# Patient Record
Sex: Female | Born: 1980 | Race: Black or African American | Hispanic: No | Marital: Single | Smoking: Former smoker
Health system: Southern US, Community
[De-identification: ages and names within clinical notes are randomized; demographics above are authoritative.]

## PROBLEM LIST (undated history)

## (undated) ENCOUNTER — Inpatient Hospital Stay (HOSPITAL_COMMUNITY): Payer: Self-pay

## (undated) DIAGNOSIS — I10 Essential (primary) hypertension: Secondary | ICD-10-CM

## (undated) DIAGNOSIS — Z5189 Encounter for other specified aftercare: Secondary | ICD-10-CM

## (undated) DIAGNOSIS — C801 Malignant (primary) neoplasm, unspecified: Secondary | ICD-10-CM

## (undated) DIAGNOSIS — F419 Anxiety disorder, unspecified: Secondary | ICD-10-CM

## (undated) HISTORY — PX: INDUCED ABORTION: SHX677

## (undated) HISTORY — PX: ECTOPIC PREGNANCY SURGERY: SHX613

---

## 2016-05-22 DIAGNOSIS — C9241 Acute promyelocytic leukemia, in remission: Secondary | ICD-10-CM | POA: Insufficient documentation

## 2016-05-22 DIAGNOSIS — I10 Essential (primary) hypertension: Secondary | ICD-10-CM | POA: Insufficient documentation

## 2016-05-22 DIAGNOSIS — D259 Leiomyoma of uterus, unspecified: Secondary | ICD-10-CM

## 2016-05-22 HISTORY — DX: Leiomyoma of uterus, unspecified: D25.9

## 2016-05-22 HISTORY — DX: Essential (primary) hypertension: I10

## 2016-09-01 ENCOUNTER — Ambulatory Visit (HOSPITAL_COMMUNITY)
Admission: EM | Admit: 2016-09-01 | Discharge: 2016-09-01 | Disposition: A | Discharge status: Home / Self Care | Payer: Medicaid Other | Attending: Internal Medicine | Admitting: Internal Medicine

## 2016-09-01 ENCOUNTER — Encounter (HOSPITAL_COMMUNITY): Payer: Self-pay | Admitting: Emergency Medicine

## 2016-09-01 DIAGNOSIS — S70361A Insect bite (nonvenomous), right thigh, initial encounter: Secondary | ICD-10-CM

## 2016-09-01 DIAGNOSIS — W57XXXA Bitten or stung by nonvenomous insect and other nonvenomous arthropods, initial encounter: Secondary | ICD-10-CM | POA: Diagnosis not present

## 2016-09-01 HISTORY — DX: Malignant (primary) neoplasm, unspecified: C80.1

## 2016-09-01 MED ORDER — TRIAMCINOLONE ACETONIDE 0.1 % EX CREA: 1.0000 "application " | TOPICAL_CREAM | Freq: Two times a day (BID) | CUTANEOUS | 0 refills | Status: DC

## 2016-09-01 NOTE — Discharge Instructions (Signed)
The lesions on the right thigh appear to be insect bites. They are mostly consistent with bedbugs bites but this cannot be proven.. They do not appear to be due to poisonous spider. As long as your not having any systemic symptoms such as fever, chills, feeling sick, nausea vomiting or other problems they should go away. Your receiving a prescription for triamcinolone cream to place on this area to help with pain, itching and discomfort. Use this medicine twice a day. He may also use Benadryl cream or gel 4 times a day on this area. The should heal up within a couple days or so.

## 2016-09-01 NOTE — ED Provider Notes (Signed)
CSN: [id number]     Arrival date & time 09/01/16  1620 History   First MD Initiated Contact with Patient 09/01/16 1741     Chief Complaint  Patient presents with  . Insect Bite   (Consider location/radiation/quality/duration/timing/severity/associated sxs/prior Treatment) 36 year old female sitting in a chair last night for a prolonged period of time. After getting up she was feeling some itching and stinging to the right posterior thigh. Upon examining this area  she discovered approximately 9 red itchy bumps appearing in a straight line. She states there was another employee that had been sitting in a chair a week earlier and received similar lesions on her body. The patient has a history of leukemia now in remission and she wanted to be checked to make sure that this would not be harmful to her.      Past Medical History:  Diagnosis Date  . Cancer ([place])    Leukemia APO   History reviewed. No pertinent surgical history. No family history on file. Social History  Substance Use Topics  . Smoking status: Never Smoker  . Smokeless tobacco: Never Used  . Alcohol use Yes     Comment: rarely- red wine   OB History    No data available     Review of Systems  Constitutional: Negative.   Respiratory: Negative.   Gastrointestinal: Negative.   Skin:       As per history of present illness  Neurological: Negative.   All other systems reviewed and are negative.   Allergies  Patient has no known allergies.  Home Medications   Prior to Admission medications   Medication Sig Start Date End Date Taking? Authorizing Provider  triamcinolone cream (KENALOG) 0.1 % Apply 1 application topically 2 (two) times daily. 09/01/16   Janne Napoleon, NP   Meds Ordered and Administered this Visit  Medications - No data to display  BP 135/77 (BP Location: Right Arm)   Pulse 85   Temp 98.6 F (37 C) (Oral)   Resp 16   Ht 5\' 2"  (1.575 m)   Wt 211 lb (95.7 kg)   LMP 08/11/2016   SpO2 98%    BMI 38.59 kg/m  No data found.   Physical Exam  Constitutional: She is oriented to person, place, and time. She appears well-developed and well-nourished.  Eyes: EOM are normal.  Neck: Neck supple.  Cardiovascular: Normal rate.   Pulmonary/Chest: Effort normal. No respiratory distress.  Neurological: She is alert and oriented to person, place, and time.  Skin: Skin is warm and dry. Capillary refill takes less than 2 seconds.  7-9 raised red papules or "bumps" arranged in a straight line to the right upper posterior thigh running vertically. The length is approximately 8 inches. No drainage. No bleeding. No signs of infection. No cellulitis.  Psychiatric: She has a normal mood and affect.  Nursing note and vitals reviewed.   Urgent Care Course     Procedures (including critical care time)  Labs Review Labs Reviewed - No data to display  Imaging Review No results found.   Visual Acuity Review  Right Eye Distance:   Left Eye Distance:   Bilateral Distance:    Right Eye Near:   Left Eye Near:    Bilateral Near:         MDM   1. Insect bite, initial encounter    The lesions on the right thigh appear to be insect bites. They are mostly consistent with bedbugs bites but this cannot be  proven.. They do not appear to be due to poisonous spider. As long as your not having any systemic symptoms such as fever, chills, feeling sick, nausea vomiting or other problems they should go away. Your receiving a prescription for triamcinolone cream to place on this area to help with pain, itching and discomfort. Use this medicine twice a day. He may also use Benadryl cream or gel 4 times a day on this area. The should heal up within a couple days or so. Meds ordered this encounter  Medications  . triamcinolone cream (KENALOG) 0.1 %    Sig: Apply 1 application topically 2 (two) times daily.    Dispense:  30 g    Refill:  0    Order Specific Question:   Supervising Provider    Answer:    Sherlene Shams [[id number]]   Post note: After the patient left andLivia, NA was cleaning the room she saw small bugs on the chair that the patient was sitting. She wiped across the chair and noticed that the small bugs were crushed and there was blood on the chair. The room was then cleaned his best as possible and then closed until housekeeping can complete the cleaning process. The patient was called and notified of this finding.    Janne Napoleon, NP 09/01/16 1801    Janne Napoleon, NP 09/01/16 2032

## 2016-09-01 NOTE — ED Triage Notes (Signed)
PT in remission of Leukemia APO. PT sat on a leather chair at work yesterday. PT reports she quickly noticed 9 bites up her right leg. PT reports bites have gotten larger and her leg is painful.

## 2017-01-06 ENCOUNTER — Encounter (HOSPITAL_COMMUNITY): Payer: Self-pay

## 2017-01-06 ENCOUNTER — Emergency Department (HOSPITAL_COMMUNITY)
Admission: EM | Admit: 2017-01-06 | Discharge: 2017-01-06 | Disposition: A | Discharge status: Home / Self Care | Payer: Medicaid Other | Attending: Emergency Medicine | Admitting: Emergency Medicine

## 2017-01-06 DIAGNOSIS — K649 Unspecified hemorrhoids: Secondary | ICD-10-CM | POA: Insufficient documentation

## 2017-01-06 DIAGNOSIS — M7918 Myalgia, other site: Secondary | ICD-10-CM | POA: Diagnosis present

## 2017-01-06 DIAGNOSIS — R52 Pain, unspecified: Secondary | ICD-10-CM

## 2017-01-06 MED ORDER — HYDROCORTISONE 1 % EX CREA: TOPICAL_CREAM | CUTANEOUS | 0 refills | Status: DC

## 2017-01-06 MED ORDER — PRAMOXINE HCL 1 % RE FOAM: 1.0000 "application " | Freq: Three times a day (TID) | RECTAL | 0 refills | Status: DC | PRN

## 2017-01-06 NOTE — ED Provider Notes (Signed)
[facility] EMERGENCY DEPART[facility] Provider Note   CSN: [id number] Arrival date & time: 01/06/17  1956     History   Chief Complaint Chief Complaint  Patient presents with  . Generalized Body Aches    HPI Alyssa Short is a 36 y.o. female.  HPI Alyssa Short is a 36 y.o. female presents to emergency department complaining of body aches and a hemorrhoid.  Patient states that she has felt achy all day today.  She states that she thinks is because she worked out yesterday.  She states she worked out strenuously after not working out for a while.  She denies any swelling in extremities.  She denies any fever or chills.  She denies any upper respiratory symptoms.  She has not taken any medications for this, she states "I do not take medicines."  She also states she has a small hemorrhoid that is uncomfortable and painful.  She has not tried any medications on it.  She states this started a few days ago.  She states she has had a once in the past and has used rectal cream that helped.  States having normal stools.  No blood per rectum.  No other complaints.  Past Medical History:  Diagnosis Date  . Cancer ([place])    Leukemia APO    There are no active problems to display for this patient.   History reviewed. No pertinent surgical history.  OB History    No data available       Home Medications    Prior to Admission medications   Medication Sig Start Date End Date Taking? Authorizing Provider  triamcinolone cream (KENALOG) 0.1 % Apply 1 application topically 2 (two) times daily. 09/01/16   Janne Napoleon, NP    Family History History reviewed. No pertinent family history.  Social History Social History  Substance Use Topics  . Smoking status: Never Smoker  . Smokeless tobacco: Never Used  . Alcohol use Yes     Comment: rarely- red wine     Allergies   Patient has no known allergies.   Review of Systems Review of Systems  Constitutional: Negative for  chills and fever.  Respiratory: Negative for cough, chest tightness and shortness of breath.   Cardiovascular: Negative for chest pain, palpitations and leg swelling.  Gastrointestinal: Positive for rectal pain. Negative for abdominal pain, blood in stool, diarrhea, nausea and vomiting.  Genitourinary: Negative for dysuria, flank pain, pelvic pain, vaginal bleeding, vaginal discharge and vaginal pain.  Musculoskeletal: Positive for arthralgias and myalgias. Negative for neck pain and neck stiffness.  Skin: Negative for rash.  Neurological: Negative for dizziness, weakness and headaches.  All other systems reviewed and are negative.    Physical Exam Updated Vital Signs BP (!) 142/94   Pulse 94   Temp 98.3 F (36.8 C) (Oral)   Resp 16   LMP 12/21/2016   SpO2 99%   Physical Exam  Constitutional: She appears well-developed and well-nourished. No distress.  HENT:  Head: Normocephalic.  Right Ear: External ear normal.  Left Ear: External ear normal.  Nose: Nose normal.  Mouth/Throat: Oropharynx is clear and moist.  Eyes: Conjunctivae are normal.  Neck: Neck supple.  Cardiovascular: Normal rate, regular rhythm and normal heart sounds.   Pulmonary/Chest: Effort normal and breath sounds normal. No respiratory distress. She has no wheezes. She has no rales.  Abdominal: Soft. Bowel sounds are normal. She exhibits no distension. There is no tenderness. There is no rebound.  Genitourinary:  Genitourinary Comments: Tiny, non-thrombosed hemorrhoid to the rectum.  Musculoskeletal: She exhibits no edema.  No obvious upper or lower extremity swelling.  Diffuse tenderness to palpation over the muscles.  Peripheral pulses intact, specifically distal radial and dorsalis pedis pulses  Neurological: She is alert.  Skin: Skin is warm and dry.  Psychiatric: She has a normal mood and affect. Her behavior is normal.  Nursing note and vitals reviewed.    ED Treatments / Results  Labs (all labs  ordered are listed, but only abnormal results are displayed) Labs Reviewed - No data to display  EKG  EKG Interpretation None       Radiology No results found.  Procedures Procedures (including critical care time)  Medications Ordered in ED Medications - No data to display   Initial Impression / Assessment and Plan / ED Course  I have reviewed the triage vital signs and the nursing notes.  Pertinent labs & imaging results that were available during my care of the patient were reviewed by me and considered in my medical decision making (see chart for details).     Patient with body aches, presumably from exercising yesterday.  Although considered rhabdomyolysis, doubt, patient states she has clear urine and no swelling in extremities.  Patient is moving without much difficulty during exam.  She is afebrile.  She is nontoxic appearing.  She has no upper respiratory symptoms.  Advised to take Tylenol Motrin and watch for color of the urine or fever.  Hemorrhoid will treat with hydrocortisone cream Proctofoam, sitz baths.  Follow-up as needed.  Vitals:   01/06/17 2003  BP: (!) 142/94  Pulse: 94  Resp: 16  Temp: 98.3 F (36.8 C)  TempSrc: Oral  SpO2: 99%     Final Clinical Impressions(s) / ED Diagnoses   Final diagnoses:  Body aches  Hemorrhoids, unspecified hemorrhoid type    New Prescriptions New Prescriptions   HYDROCORTISONE CREAM 1 %    Apply to affected area 2 times daily   PRAMOXINE (PROCTOFOAM) 1 % FOAM    Place 1 application rectally 3 (three) times daily as needed for anal itching.     Jeannett Senior, PA-C 01/06/17 2126    Duffy Bruce, MD 01/07/17 [phone]

## 2017-01-06 NOTE — Discharge Instructions (Signed)
Warm baths for hemorrhoids several times a day.  Apply hydrocortisone cream and Proctofoam topically for symptom relief.  Take Tylenol or Motrin for any muscular pain.  Watch for darkening of the urine or swelling to extremities.  If that occurs, follow-up with your doctor or return to emergency department. Increase fluid intake

## 2017-01-06 NOTE — ED Triage Notes (Signed)
Onset today generalized body aches.  Daughter has had cold/cough symptoms x 3 days.  Pt also exercised yesterday so she thinks that might be cause of body aches.  NO other s/s noted.

## 2017-01-31 ENCOUNTER — Emergency Department (HOSPITAL_COMMUNITY)
Admission: EM | Admit: 2017-01-31 | Discharge: 2017-02-01 | Discharge status: Against Medical Advice | Payer: Medicaid Other

## 2017-01-31 NOTE — ED Triage Notes (Signed)
Patient was called for triage with no answer.

## 2017-02-01 ENCOUNTER — Other Ambulatory Visit: Payer: Self-pay

## 2017-02-01 ENCOUNTER — Emergency Department (HOSPITAL_BASED_OUTPATIENT_CLINIC_OR_DEPARTMENT_OTHER)
Admission: EM | Admit: 2017-02-01 | Discharge: 2017-02-01 | Disposition: A | Discharge status: Home / Self Care | Payer: Medicaid Other | Attending: Emergency Medicine | Admitting: Emergency Medicine

## 2017-02-01 ENCOUNTER — Encounter (HOSPITAL_BASED_OUTPATIENT_CLINIC_OR_DEPARTMENT_OTHER): Payer: Self-pay

## 2017-02-01 DIAGNOSIS — Z856 Personal history of leukemia: Secondary | ICD-10-CM | POA: Diagnosis not present

## 2017-02-01 DIAGNOSIS — R05 Cough: Secondary | ICD-10-CM | POA: Diagnosis present

## 2017-02-01 DIAGNOSIS — J069 Acute upper respiratory infection, unspecified: Secondary | ICD-10-CM | POA: Insufficient documentation

## 2017-02-01 DIAGNOSIS — B9789 Other viral agents as the cause of diseases classified elsewhere: Secondary | ICD-10-CM | POA: Diagnosis not present

## 2017-02-01 HISTORY — DX: Encounter for other specified aftercare: Z51.89

## 2017-02-01 NOTE — ED Notes (Signed)
Pt verbalizes understanding of d/c instructions and denies any further needs at this time. 

## 2017-02-01 NOTE — Discharge Instructions (Signed)
You may alternate Tylenol 1000 mg every 6 hours as needed for fever and pain and ibuprofen 800 mg every 8 hours as needed for fever and pain. Please rest and drink plenty of fluids. This is a viral illness causing your symptoms. You do not need antibiotics for a virus. You may use over-the-counter nasal saline spray and Afrin nasal saline spray as needed for nasal congestion. Please do not use Afrin for more than 3 days in a row. You may use Mucinex as needed for cough.  This may take 7-14 days to run its course.

## 2017-02-01 NOTE — ED Triage Notes (Signed)
Pt c/o cold symptoms for one day, states she has to be checked out due to hx of cancer, no fevers, no wheezing, pt is speaking in complete sentences and not having difficulty.  Pt has tried theraflu twice

## 2017-02-01 NOTE — ED Provider Notes (Signed)
TIME SEEN: 12:50 AM  CHIEF COMPLAINT: "I have a cold"  HPI: Patient is a 36 year old female with previous history of leukemia in remission for 2 years who presents to the emergency department with a "cold".  States she has had a dry cough, nasal congestion, body aches and fatigue for the past 2 days.  No fevers.  No shortness of breath.  States she thinks she has been wheezing.  No history of asthma.  No vomiting or diarrhea.  No sore throat.  Has not had an influenza vaccination.  Significant other recently had similar symptom on their own after several days.  He did not require antibiotics.  ROS: See HPI Constitutional: no fever  Eyes: no drainage  ENT: no runny nose   Cardiovascular:  no chest pain  Resp: no SOB  GI: no vomiting GU: no dysuria Integumentary: no rash  Allergy: no hives  Musculoskeletal: no leg swelling  Neurological: no slurred speech ROS otherwise negative  PAST MEDICAL HISTORY/PAST SURGICAL HISTORY:  Past Medical History:  Diagnosis Date  . Blood transfusion without reported diagnosis   . Cancer ([place])    Leukemia APO    MEDICATIONS:  Prior to Admission medications   Medication Sig Start Date End Date Taking? Authorizing Provider  hydrocortisone cream 1 % Apply to affected area 2 times daily 01/06/17   Kirichenko, Tatyana, PA-C  pramoxine (PROCTOFOAM) 1 % foam Place 1 application rectally 3 (three) times daily as needed for anal itching. 01/06/17   Kirichenko, Lahoma Rocker, PA-C  triamcinolone cream (KENALOG) 0.1 % Apply 1 application topically 2 (two) times daily. 09/01/16   Janne Napoleon, NP    ALLERGIES:  No Known Allergies  SOCIAL HISTORY:  Social History   Tobacco Use  . Smoking status: Never Smoker  . Smokeless tobacco: Never Used  Substance Use Topics  . Alcohol use: Yes    Comment: rarely- red wine    FAMILY HISTORY: No family history on file.  EXAM: BP 134/88 (BP Location: Right Arm)   Pulse 90   Temp 98.2 F (36.8 C) (Oral)   Resp 18   Ht  5\' 2"  (1.575 m)   Wt 96.2 kg (212 lb)   LMP 01/17/2017   SpO2 100%   BMI 38.78 kg/m  CONSTITUTIONAL: Alert and oriented and responds appropriately to questions. Well-appearing; well-nourished, afebrile and nontoxic HEAD: Normocephalic EYES: Conjunctivae clear, pupils appear equal, EOMI ENT: normal nose; moist mucous membranes; No pharyngeal erythema or petechiae, no tonsillar hypertrophy or exudate, no uvular deviation, no unilateral swelling, no trismus or drooling, no muffled voice, normal phonation, no stridor, no dental caries present, no drainable dental abscess noted, no Ludwig's angina, tongue sits flat in the bottom of the mouth, no angioedema, no facial erythema or warmth, no facial swelling; no pain with movement of the neck. NECK: Supple, no meningismus, no nuchal rigidity, no LAD  CARD: RRR; S1 and S2 appreciated; no murmurs, no clicks, no rubs, no gallops RESP: Normal chest excursion without splinting or tachypnea; breath sounds clear and equal bilaterally; no wheezes, no rhonchi, no rales, no hypoxia or respiratory distress, speaking full sentences ABD/GI: Normal bowel sounds; non-distended; soft, non-tender, no rebound, no guarding, no peritoneal signs, no hepatosplenomegaly BACK:  The back appears normal and is non-tender to palpation, there is no CVA tenderness EXT: Normal ROM in all joints; non-tender to palpation; no edema; normal capillary refill; no cyanosis, no calf tenderness or swelling    SKIN: Normal color for age and race; warm; no rash  NEURO: Moves all extremities equally PSYCH: The patient's mood and manner are appropriate. Grooming and personal hygiene are appropriate.  MEDICAL DECISION MAKING: Patient here with likely viral URI.  Her lungs are clear to auscultation without wheezing.  Afebrile.  I do not feel she has pneumonia.  Do not feel she needs a chest x-ray and she is comfortable with this.  Discussed with her that this is likely viral illness and could even  be influenza but less likely given no fever.  Recommended over-the-counter supportive care.  She is comfortable with this plan.  States the major reason she is here is because she needs a note for work.  She is asking to be out for the next couple of days which I do not feel is unreasonable.  I have even offered her a prescription for cough medication but she declines this.  He states she feels she is doing well with TheraFlu at home.  We discussed return precautions.  Patient comfortable with this plan.  I do not feel she needs antibiotics.  No signs of sepsis, meningitis, peritonsillar abscess, pharyngitis, deep space neck infection, pneumonia on exam.  At this time, I do not feel there is any life-threatening condition present. I have reviewed and discussed all results (EKG, imaging, lab, urine as appropriate) and exam findings with patient/family. I have reviewed nursing notes and appropriate previous records.  I feel the patient is safe to be discharged home without further emergent workup and can continue workup as an outpatient as needed. Discussed usual and customary return precautions. Patient/family verbalize understanding and are comfortable with this plan.  Outpatient follow-up has been provided if needed. All questions have been answered.      Ronna Herskowitz, Delice Bison, DO 02/01/17 [phone]

## 2017-05-16 ENCOUNTER — Emergency Department (HOSPITAL_BASED_OUTPATIENT_CLINIC_OR_DEPARTMENT_OTHER)
Admission: EM | Admit: 2017-05-16 | Discharge: 2017-05-16 | Disposition: A | Discharge status: Home / Self Care | Payer: Medicare Other | Attending: Emergency Medicine | Admitting: Emergency Medicine

## 2017-05-16 ENCOUNTER — Encounter (HOSPITAL_BASED_OUTPATIENT_CLINIC_OR_DEPARTMENT_OTHER): Payer: Self-pay | Admitting: Emergency Medicine

## 2017-05-16 ENCOUNTER — Other Ambulatory Visit: Payer: Self-pay

## 2017-05-16 ENCOUNTER — Emergency Department (HOSPITAL_BASED_OUTPATIENT_CLINIC_OR_DEPARTMENT_OTHER): Payer: Medicare Other

## 2017-05-16 DIAGNOSIS — R0602 Shortness of breath: Secondary | ICD-10-CM | POA: Diagnosis not present

## 2017-05-16 DIAGNOSIS — E876 Hypokalemia: Secondary | ICD-10-CM | POA: Diagnosis not present

## 2017-05-16 DIAGNOSIS — R002 Palpitations: Secondary | ICD-10-CM | POA: Diagnosis not present

## 2017-05-16 DIAGNOSIS — F419 Anxiety disorder, unspecified: Secondary | ICD-10-CM | POA: Insufficient documentation

## 2017-05-16 DIAGNOSIS — I10 Essential (primary) hypertension: Secondary | ICD-10-CM | POA: Insufficient documentation

## 2017-05-16 DIAGNOSIS — Z856 Personal history of leukemia: Secondary | ICD-10-CM | POA: Insufficient documentation

## 2017-05-16 HISTORY — DX: Essential (primary) hypertension: I10

## 2017-05-16 LAB — PREGNANCY, URINE: Preg Test, Ur: NEGATIVE

## 2017-05-16 LAB — BASIC METABOLIC PANEL
Anion gap: 10 (ref 5–15)
BUN: 11 mg/dL (ref 6–20)
CHLORIDE: 103 mmol/L (ref 101–111)
CO2: 22 mmol/L (ref 22–32)
CREATININE: 0.72 mg/dL (ref 0.44–1.00)
Calcium: 9.5 mg/dL (ref 8.9–10.3)
GFR calc Af Amer: >60 mL/min (ref >60)
GFR calc non Af Amer: >60 mL/min (ref >60)
Glucose, Bld: 119 mg/dL — ABNORMAL HIGH (ref 65–99)
Potassium: 3.1 mmol/L — ABNORMAL LOW (ref 3.5–5.1)
Sodium: 135 mmol/L (ref 135–145)

## 2017-05-16 LAB — CBC
HCT: 36.5 % (ref 36.0–46.0)
Hemoglobin: 12.3 g/dL (ref 12.0–15.0)
MCH: 31.6 pg (ref 26.0–34.0)
MCHC: 33.7 g/dL (ref 30.0–36.0)
MCV: 93.8 fL (ref 78.0–100.0)
PLATELETS: 351 10*3/uL (ref 150–400)
RBC: 3.89 MIL/uL (ref 3.87–5.11)
RDW: 12.3 % (ref 11.5–15.5)
WBC: 9.6 10*3/uL (ref 4.0–10.5)

## 2017-05-16 LAB — MAGNESIUM: MAGNESIUM: 1.9 mg/dL (ref 1.7–2.4)

## 2017-05-16 LAB — TROPONIN I: Troponin I: <0.03 ng/mL (ref <0.03)

## 2017-05-16 LAB — D-DIMER, QUANTITATIVE: D-Dimer, Quant: 0.29 ug/mL-FEU (ref 0.00–0.50)

## 2017-05-16 MED ORDER — ALPRAZOLAM 0.25 MG PO TABS: 0.2500 mg | ORAL_TABLET | Freq: Three times a day (TID) | ORAL | 0 refills | Status: DC | PRN

## 2017-05-16 MED ORDER — POTASSIUM CHLORIDE CRYS ER 20 MEQ PO TBCR
40.0000 meq | EXTENDED_RELEASE_TABLET | Freq: Once | ORAL | Status: AC
Start: 2017-05-16 — End: 2017-05-16
  Administered 2017-05-16: 40 meq via ORAL
  Filled 2017-05-16: qty 2

## 2017-05-16 MED ORDER — POTASSIUM CHLORIDE CRYS ER 20 MEQ PO TBCR: 20.0000 meq | EXTENDED_RELEASE_TABLET | Freq: Every day | ORAL | 0 refills | Status: DC

## 2017-05-16 MED ORDER — ALPRAZOLAM 0.5 MG PO TABS
0.2500 mg | ORAL_TABLET | Freq: Once | ORAL | Status: AC
  Administered 2017-05-16: 0.25 mg via ORAL
  Filled 2017-05-16: qty 1

## 2017-05-16 NOTE — ED Triage Notes (Signed)
Palpitations intermittently over the past few days. Denies chest pain.

## 2017-05-16 NOTE — ED Notes (Signed)
Pt verbalizes understanding of d/c instructions and denies any further needs at this time. 

## 2017-05-16 NOTE — ED Provider Notes (Signed)
[facility] EMERGENCY DEPARTMENT Provider Note   CSN: [id number] Arrival date & time: 05/16/17  2032     History   Chief Complaint Chief Complaint  Patient presents with  . Palpitations    HPI Alyssa Short is a 37 y.o. female.  HPI Patient presents with worsening palpitations of the last week.  States she has some mild associated shortness of breath.  Symptoms are exacerbated or being in the hospital around doctors.  Patient states he does drink large chai tea daily.  Denies any other stimulant use.  Denies any chest pain.  Denies recent weight loss.  No fever or chills.   Past Medical History:  Diagnosis Date  . Blood transfusion without reported diagnosis   . Cancer ([place])    Leukemia APO  . Hypertension     There are no active problems to display for this patient.   Past Surgical History:  Procedure Laterality Date  . ECTOPIC PREGNANCY SURGERY      OB History    No data available       Home Medications    Prior to Admission medications   Medication Sig Start Date End Date Taking? Authorizing Provider  ALPRAZolam (XANAX) 0.25 MG tablet Take 1 tablet (0.25 mg total) by mouth 3 (three) times daily as needed for anxiety. 05/16/17   Julianne Rice, MD  hydrocortisone cream 1 % Apply to affected area 2 times daily 01/06/17   Kirichenko, Lahoma Rocker, PA-C  potassium chloride SA (K-DUR,KLOR-CON) 20 MEQ tablet Take 1 tablet (20 mEq total) by mouth daily. 05/16/17   Julianne Rice, MD  pramoxine (PROCTOFOAM) 1 % foam Place 1 application rectally 3 (three) times daily as needed for anal itching. 01/06/17   Kirichenko, Lahoma Rocker, PA-C  triamcinolone cream (KENALOG) 0.1 % Apply 1 application topically 2 (two) times daily. 09/01/16   Janne Napoleon, NP    Family History No family history on file.  Social History Social History   Tobacco Use  . Smoking status: Never Smoker  . Smokeless tobacco: Never Used  Substance Use Topics  . Alcohol use: Yes    Comment: rarely-  red wine  . Drug use: Yes    Types: Marijuana     Allergies   Patient has no known allergies.   Review of Systems Review of Systems  Constitutional: Negative for chills, fatigue and fever.  HENT: Negative for congestion, facial swelling, sore throat and trouble swallowing.   Eyes: Negative for visual disturbance.  Respiratory: Positive for shortness of breath. Negative for cough and wheezing.   Cardiovascular: Positive for palpitations. Negative for chest pain and leg swelling.  Gastrointestinal: Negative for abdominal pain, constipation, diarrhea, nausea and vomiting.  Genitourinary: Negative for dysuria, flank pain and frequency.  Musculoskeletal: Negative for back pain, myalgias, neck pain and neck stiffness.  Skin: Negative for rash and wound.  Neurological: Negative for dizziness, tremors, weakness, light-headedness, numbness and headaches.  Psychiatric/Behavioral: The patient is nervous/anxious.   All other systems reviewed and are negative.    Physical Exam Updated Vital Signs BP 131/88   Pulse 90   Temp 98.8 F (37.1 C) (Oral)   Resp 17   Ht 5\' 2"  (1.575 m)   Wt 96.2 kg (212 lb)   LMP 04/21/2017   SpO2 100%   BMI 38.78 kg/m   Physical Exam  Constitutional: She is oriented to person, place, and time. She appears well-developed and well-nourished. No distress.  HENT:  Head: Normocephalic and atraumatic.  Mouth/Throat: Oropharynx is clear  and moist. No oropharyngeal exudate.  Eyes: EOM are normal. Pupils are equal, round, and reactive to light.  Neck: Normal range of motion. Neck supple. No JVD present.  Cardiovascular: Regular rhythm.  Tachycardia  Pulmonary/Chest: Effort normal and breath sounds normal. No stridor. No respiratory distress. She has no wheezes. She has no rales. She exhibits no tenderness.  Abdominal: Soft. Bowel sounds are normal. There is no tenderness. There is no rebound and no guarding.  Musculoskeletal: Normal range of motion. She  exhibits no edema or tenderness.  Mild right calf swelling compared to left.  No tenderness.  Distal pulses are 2+.  Lymphadenopathy:    She has no cervical adenopathy.  Neurological: She is alert and oriented to person, place, and time.  Patient is alert and oriented x3 with clear, goal oriented speech. Patient has 5/5 motor in all extremities. Sensation is intact to light touch.  Mild tremor noted in bilateral upper extremities.  Skin: Skin is warm and dry. Capillary refill takes less than 2 seconds. No rash noted. She is not diaphoretic. No erythema.  Psychiatric: She has a normal mood and affect. Her behavior is normal.  Nursing note and vitals reviewed.    ED Treatments / Results  Labs (all labs ordered are listed, but only abnormal results are displayed) Labs Reviewed  BASIC METABOLIC PANEL - Abnormal; Notable for the following components:      Result Value   Potassium 3.1 (*)    Glucose, Bld 119 (*)    All other components within normal limits  CBC  TROPONIN I  PREGNANCY, URINE  D-DIMER, QUANTITATIVE (NOT AT [facility])  MAGNESIUM  TSH    EKG  EKG Interpretation  Date/Time:  Wednesday May 16 2017 20:40:02 EDT Ventricular Rate:  137 PR Interval:  136 QRS Duration: 84 QT Interval:  280 QTC Calculation: 422 R Axis:   34 Text Interpretation:  Sinus tachycardia Otherwise normal ECG Confirmed by Julianne Rice [phone]) on 05/16/2017 10:24:03 PM       Radiology Dg Chest 2 View  Result Date: 05/16/2017 CLINICAL DATA:  Palpitations for a few days. EXAM: CHEST - 2 VIEW COMPARISON:  None. FINDINGS: The heart size and mediastinal contours are within normal limits. Both lungs are clear. The visualized skeletal structures are unremarkable. IMPRESSION: Normal chest. Electronically Signed   By: Ulyses Jarred M.D.   On: 05/16/2017 23:07    Procedures Procedures (including critical care time)  Medications Ordered in ED Medications  ALPRAZolam Duanne Moron) tablet 0.25 mg (0.25 mg  Oral Given 05/16/17 2222)  potassium chloride SA (K-DUR,KLOR-CON) CR tablet 40 mEq (40 mEq Oral Given 05/16/17 2225)     Initial Impression / Assessment and Plan / ED Course  I have reviewed the triage vital signs and the nursing notes.  Pertinent labs & imaging results that were available during my care of the patient were reviewed by me and considered in my medical decision making (see chart for details).    Patient states she is feeling much better after Xanax.  Heart rate is not rising over 100.  D-dimer is normal.  Chest x-ray is normal.  Patient does have hypokalemia.  Given oral replacement in the emergency department.  Suspect symptoms likely related to anxiety.  However, will give follow-up with cardiology and advised follow-up with her primary physician.  Return precautions have been given.   Final Clinical Impressions(s) / ED Diagnoses   Final diagnoses:  Palpitations  Anxiety  Hypokalemia    ED Discharge Orders  Ordered    ALPRAZolam (XANAX) 0.25 MG tablet  3 times daily PRN     05/16/17 2333    potassium chloride SA (K-DUR,KLOR-CON) 20 MEQ tablet  Daily     05/16/17 2333       Julianne Rice, MD 05/16/17 2342

## 2017-05-17 LAB — TSH: TSH: 3.178 u[IU]/mL (ref 0.350–4.500)

## 2017-12-23 ENCOUNTER — Other Ambulatory Visit: Payer: Self-pay

## 2017-12-23 ENCOUNTER — Emergency Department (HOSPITAL_BASED_OUTPATIENT_CLINIC_OR_DEPARTMENT_OTHER)
Admission: EM | Admit: 2017-12-23 | Discharge: 2017-12-23 | Disposition: A | Discharge status: Home / Self Care | Payer: Medicare Other | Attending: Emergency Medicine | Admitting: Emergency Medicine

## 2017-12-23 ENCOUNTER — Encounter (HOSPITAL_BASED_OUTPATIENT_CLINIC_OR_DEPARTMENT_OTHER): Payer: Self-pay | Admitting: *Deleted

## 2017-12-23 DIAGNOSIS — F1210 Cannabis abuse, uncomplicated: Secondary | ICD-10-CM | POA: Insufficient documentation

## 2017-12-23 DIAGNOSIS — I10 Essential (primary) hypertension: Secondary | ICD-10-CM | POA: Diagnosis not present

## 2017-12-23 DIAGNOSIS — R110 Nausea: Secondary | ICD-10-CM | POA: Diagnosis not present

## 2017-12-23 DIAGNOSIS — Z856 Personal history of leukemia: Secondary | ICD-10-CM | POA: Insufficient documentation

## 2017-12-23 DIAGNOSIS — R002 Palpitations: Secondary | ICD-10-CM | POA: Insufficient documentation

## 2017-12-23 DIAGNOSIS — E876 Hypokalemia: Secondary | ICD-10-CM

## 2017-12-23 DIAGNOSIS — F419 Anxiety disorder, unspecified: Secondary | ICD-10-CM | POA: Diagnosis not present

## 2017-12-23 LAB — COMPREHENSIVE METABOLIC PANEL
ALT: 21 U/L (ref 0–44)
AST: 22 U/L (ref 15–41)
Albumin: 4.5 g/dL (ref 3.5–5.0)
Alkaline Phosphatase: 72 U/L (ref 38–126)
Anion gap: 10 (ref 5–15)
BUN: 7 mg/dL (ref 6–20)
CO2: 25 mmol/L (ref 22–32)
CREATININE: 0.62 mg/dL (ref 0.44–1.00)
Calcium: 9.8 mg/dL (ref 8.9–10.3)
Chloride: 103 mmol/L (ref 98–111)
Glucose, Bld: 113 mg/dL — ABNORMAL HIGH (ref 70–99)
Potassium: 3 mmol/L — ABNORMAL LOW (ref 3.5–5.1)
Sodium: 138 mmol/L (ref 135–145)
Total Bilirubin: 0.3 mg/dL (ref 0.3–1.2)
Total Protein: 7.9 g/dL (ref 6.5–8.1)

## 2017-12-23 LAB — CBC
HCT: 38 % (ref 36.0–46.0)
Hemoglobin: 12.3 g/dL (ref 12.0–15.0)
MCH: 30.3 pg (ref 26.0–34.0)
MCHC: 32.4 g/dL (ref 30.0–36.0)
MCV: 93.6 fL (ref 80.0–100.0)
NRBC: 0 % (ref 0.0–0.2)
PLATELETS: 397 10*3/uL (ref 150–400)
RBC: 4.06 MIL/uL (ref 3.87–5.11)
RDW: 13 % (ref 11.5–15.5)
WBC: 7.9 10*3/uL (ref 4.0–10.5)

## 2017-12-23 LAB — URINALYSIS, ROUTINE W REFLEX MICROSCOPIC
BILIRUBIN URINE: NEGATIVE
GLUCOSE, UA: NEGATIVE mg/dL
HGB URINE DIPSTICK: NEGATIVE
KETONES UR: NEGATIVE mg/dL
LEUKOCYTES UA: NEGATIVE
Nitrite: NEGATIVE
PROTEIN: NEGATIVE mg/dL
Specific Gravity, Urine: <1.005 — ABNORMAL LOW (ref 1.005–1.030)
pH: 5.5 (ref 5.0–8.0)

## 2017-12-23 LAB — PREGNANCY, URINE: Preg Test, Ur: NEGATIVE

## 2017-12-23 LAB — LIPASE, BLOOD: Lipase: 20 U/L (ref 11–51)

## 2017-12-23 MED ORDER — ONDANSETRON HCL 4 MG/2ML IJ SOLN
4.0000 mg | Freq: Once | INTRAMUSCULAR | Status: AC
  Administered 2017-12-23: 4 mg via INTRAVENOUS
  Filled 2017-12-23: qty 2

## 2017-12-23 MED ORDER — HYDROXYZINE HCL 25 MG PO TABS: 25.0000 mg | ORAL_TABLET | Freq: Every evening | ORAL | 0 refills | Status: DC | PRN

## 2017-12-23 MED ORDER — POTASSIUM CHLORIDE CRYS ER 20 MEQ PO TBCR
40.0000 meq | EXTENDED_RELEASE_TABLET | Freq: Once | ORAL | Status: AC
  Administered 2017-12-23: 40 meq via ORAL
  Filled 2017-12-23: qty 2

## 2017-12-23 MED ORDER — LORAZEPAM 2 MG/ML IJ SOLN
1.0000 mg | Freq: Once | INTRAMUSCULAR | Status: AC
  Administered 2017-12-23: 1 mg via INTRAVENOUS
  Filled 2017-12-23: qty 1

## 2017-12-23 NOTE — ED Notes (Signed)
Pt active in lobby, eating and drinking without problem

## 2017-12-23 NOTE — ED Notes (Signed)
Pt tolerating POs at this time

## 2017-12-23 NOTE — ED Triage Notes (Addendum)
Pt reports that she thinks she is having panic attacks.  She is in remission from CA. States that she has been nauseated after eating x 3 days. Reports posterior neck pain after going to the gym. Pt ambulatory. No acute distress noted.  Pt does report using Marijuana today.

## 2017-12-23 NOTE — Discharge Instructions (Signed)
Please follow up with counselor or therapist. Eat potassium rich foods - a list has been included Take Hydroxyzine as needed for sleep/anxiety Please return if you are worsening

## 2017-12-23 NOTE — ED Provider Notes (Signed)
[facility] EMERGENCY DEPARTMENT Provider Note   CSN: [id number] Arrival date & time: 12/23/17  1547     History   Chief Complaint Chief Complaint  Patient presents with  . Nausea    HPI Alyssa Short is a 37 y.o. female who presents with multiple complaints. PMH significant for leukemia in remission, anxiety/PTSD. She was seen in the ED in March 2019 for palpitations and testing came back normal and she was told she has anxiety. She was given Xanax and on review of EMR this seemed to help although now the patient states that it doesn't really help. She is not receiving any treatment for anxiety at this time. For the past three days she has had a feeling of nausea. She never gets to the point where she needs to vomit. She denies any pain associated with this. She states that she is under increased stress because any time she has to talk about cancer it makes her very anxious and have palpitations. Her friend recently called her and was asking questions about cancer because her white count was elevated and this triggered her. She denies fever, chest pain, SOB, abdominal pain, change in bowels, dysuria. No recent surgery/immobilization, hx of cancer, leg swelling, hemoptysis, prior DVT/PE, or hormone use. She did recently go to [facility] by train.  Additionally she has been having some left sided neck and shoulder pain. She has been working out recently and thinks it might be from that. It hurts when she moves a certain way. It is not constant or severe.  HPI  Past Medical History:  Diagnosis Date  . Blood transfusion without reported diagnosis   . Cancer ([place])    Leukemia APO  . Hypertension     There are no active problems to display for this patient.   Past Surgical History:  Procedure Laterality Date  . ECTOPIC PREGNANCY SURGERY       OB History   None      Home Medications    Prior to Admission medications   Medication Sig Start Date End Date Taking? Authorizing  Provider  ALPRAZolam (XANAX) 0.25 MG tablet Take 1 tablet (0.25 mg total) by mouth 3 (three) times daily as needed for anxiety. 05/16/17   Julianne Rice, MD  hydrocortisone cream 1 % Apply to affected area 2 times daily 01/06/17   Kirichenko, Lahoma Rocker, PA-C  potassium chloride SA (K-DUR,KLOR-CON) 20 MEQ tablet Take 1 tablet (20 mEq total) by mouth daily. 05/16/17   Julianne Rice, MD  pramoxine (PROCTOFOAM) 1 % foam Place 1 application rectally 3 (three) times daily as needed for anal itching. 01/06/17   Kirichenko, Lahoma Rocker, PA-C  triamcinolone cream (KENALOG) 0.1 % Apply 1 application topically 2 (two) times daily. 09/01/16   Janne Napoleon, NP    Family History History reviewed. No pertinent family history.  Social History Social History   Tobacco Use  . Smoking status: Never Smoker  . Smokeless tobacco: Never Used  Substance Use Topics  . Alcohol use: Yes    Comment: rarely- red wine  . Drug use: Yes    Types: Marijuana     Allergies   Patient has no known allergies.   Review of Systems Review of Systems  Constitutional: Positive for appetite change. Negative for chills and fever.  Respiratory: Negative for shortness of breath.   Cardiovascular: Positive for palpitations. Negative for chest pain and leg swelling.  Gastrointestinal: Positive for nausea. Negative for abdominal pain, constipation, diarrhea and vomiting.  Genitourinary: Negative for  dysuria.  Musculoskeletal: Positive for myalgias and neck pain.  Neurological: Negative for headaches.  Psychiatric/Behavioral: The patient is nervous/anxious.   All other systems reviewed and are negative.    Physical Exam Updated Vital Signs BP (!) 187/95 (BP Location: Left Arm)   Pulse (!) 132   Temp 98.1 F (36.7 C) (Oral)   Resp 20   Ht 5\' 2"  (1.575 m)   Wt 96.2 kg   LMP 12/20/2017   SpO2 100%   BMI 38.78 kg/m   Physical Exam  Constitutional: She is oriented to person, place, and time. She appears well-developed  and well-nourished. No distress.  Cooperative. Mildly anxious. Pleasant  HENT:  Head: Normocephalic and atraumatic.  Eyes: Pupils are equal, round, and reactive to light. Conjunctivae are normal. Right eye exhibits no discharge. Left eye exhibits no discharge. No scleral icterus.  Neck: Normal range of motion.  Mild tenderness over left cervical paraspinal muscles and left trapezius  Cardiovascular: Regular rhythm. Tachycardia present. Exam reveals no gallop and no friction rub.  No murmur heard. Pulmonary/Chest: Effort normal and breath sounds normal. No respiratory distress.  Abdominal: Soft. Bowel sounds are normal. She exhibits no distension. There is no tenderness.  Musculoskeletal:  No lower leg edema  Neurological: She is alert and oriented to person, place, and time.  Skin: Skin is warm and dry.  Psychiatric: Her behavior is normal. Her mood appears anxious.  Nursing note and vitals reviewed.    ED Treatments / Results  Labs (all labs ordered are listed, but only abnormal results are displayed) Labs Reviewed  COMPREHENSIVE METABOLIC PANEL - Abnormal; Notable for the following components:      Result Value   Potassium 3.0 (*)    Glucose, Bld 113 (*)    All other components within normal limits  URINALYSIS, ROUTINE W REFLEX MICROSCOPIC - Abnormal; Notable for the following components:   Specific Gravity, Urine <1.005 (*)    All other components within normal limits  LIPASE, BLOOD  CBC  PREGNANCY, URINE    EKG EKG Interpretation  Date/Time:  Sunday December 23 2017 16:00:28 EDT Ventricular Rate:  134 PR Interval:  138 QRS Duration: 90 QT Interval:  292 QTC Calculation: 436 R Axis:   41 Text Interpretation:  Sinus tachycardia Otherwise normal ECG similar to prior 3/19 Confirmed by Aletta Edouard [phone]) on 12/23/2017 4:02:56 PM Also confirmed by Aletta Edouard [phone]), editor Philomena Doheny [phone])  on 12/23/2017 4:04:40 PM   Radiology No results  found.  Procedures Procedures (including critical care time)  Medications Ordered in ED Medications  LORazepam (ATIVAN) injection 1 mg (1 mg Intravenous Given 12/23/17 1736)  ondansetron (ZOFRAN) injection 4 mg (4 mg Intravenous Given 12/23/17 1737)  potassium chloride SA (K-DUR,KLOR-CON) CR tablet 40 mEq (40 mEq Oral Given 12/23/17 1944)     Initial Impression / Assessment and Plan / ED Course  I have reviewed the triage vital signs and the nursing notes.  Pertinent labs & imaging results that were available during my care of the patient were reviewed by me and considered in my medical decision making (see chart for details).  37 year old female presents with multiple symptoms. She is tachycardic in to 130s and hypertensive. She openly admits to her anxiety and stress. On exam she has mild, reproducible neck and trapezius tenderness, clear lungs, soft, non-tender abdomen. Will obtain labs, pregnancy test. She is technically not PERC negative because of her HR. Her Wells' score puts her in the low risk group. With  no chest pain, SOB, hypoxia, or clinical signs of DVT will forego d-dimer at this time.  Labs are overall normal other than hypokalemia. Discussed results with patient. Her HR and BP have improved after Ativan and Zofran. She was encouraged to follow-up with a counselor or therapist to address her uncontrolled anxiety.  She is given prescription for hydroxyzine for anxiety and sleep.  Final Clinical Impressions(s) / ED Diagnoses   Final diagnoses:  Nausea  Anxiety  Hypokalemia    ED Discharge Orders    None       Recardo Evangelist, PA-C 12/23/17 2200    Hayden Rasmussen, MD 12/23/17 2320

## 2018-01-25 ENCOUNTER — Emergency Department (HOSPITAL_BASED_OUTPATIENT_CLINIC_OR_DEPARTMENT_OTHER)
Admission: EM | Admit: 2018-01-25 | Discharge: 2018-01-25 | Disposition: A | Discharge status: Home / Self Care | Payer: Medicare Other | Attending: Emergency Medicine | Admitting: Emergency Medicine

## 2018-01-25 ENCOUNTER — Encounter (HOSPITAL_BASED_OUTPATIENT_CLINIC_OR_DEPARTMENT_OTHER): Payer: Self-pay | Admitting: *Deleted

## 2018-01-25 ENCOUNTER — Inpatient Hospital Stay (EMERGENCY_DEPARTMENT_HOSPITAL)
Admission: AD | Admit: 2018-01-25 | Discharge: 2018-01-26 | Disposition: A | Discharge status: Home / Self Care | Payer: Medicare Other | Source: Ambulatory Visit | Attending: Obstetrics & Gynecology | Admitting: Obstetrics & Gynecology

## 2018-01-25 ENCOUNTER — Other Ambulatory Visit: Payer: Self-pay

## 2018-01-25 DIAGNOSIS — D259 Leiomyoma of uterus, unspecified: Secondary | ICD-10-CM

## 2018-01-25 DIAGNOSIS — Z8759 Personal history of other complications of pregnancy, childbirth and the puerperium: Secondary | ICD-10-CM

## 2018-01-25 DIAGNOSIS — Z9079 Acquired absence of other genital organ(s): Secondary | ICD-10-CM

## 2018-01-25 DIAGNOSIS — R109 Unspecified abdominal pain: Secondary | ICD-10-CM | POA: Insufficient documentation

## 2018-01-25 DIAGNOSIS — Z3491 Encounter for supervision of normal pregnancy, unspecified, first trimester: Secondary | ICD-10-CM

## 2018-01-25 DIAGNOSIS — O26891 Other specified pregnancy related conditions, first trimester: Secondary | ICD-10-CM

## 2018-01-25 DIAGNOSIS — Z5321 Procedure and treatment not carried out due to patient leaving prior to being seen by health care provider: Secondary | ICD-10-CM | POA: Insufficient documentation

## 2018-01-25 DIAGNOSIS — O3411 Maternal care for benign tumor of corpus uteri, first trimester: Secondary | ICD-10-CM | POA: Insufficient documentation

## 2018-01-25 DIAGNOSIS — Z87891 Personal history of nicotine dependence: Secondary | ICD-10-CM | POA: Insufficient documentation

## 2018-01-25 DIAGNOSIS — Z3A01 Less than 8 weeks gestation of pregnancy: Secondary | ICD-10-CM | POA: Insufficient documentation

## 2018-01-25 HISTORY — DX: Anxiety disorder, unspecified: F41.9

## 2018-01-25 NOTE — ED Notes (Signed)
During triage pt inquiring about plan of care. I spoke with dr. Florina Ou and order rec'd for hcg quant level. Discussed with pt we would take her to treatment room,  draw labs, and she would likely need transfer to [facility] for ultrasound. Pt said she "called ahead" and was told that we did have ultrasound here, I discussed with her that we do not have it 24 hours. Pt [facility] that [facility] Advised her that we are happy to take care of her here, pt decided to leave. Pt ambulatory with sig other out triage doors.

## 2018-01-25 NOTE — ED Triage Notes (Signed)
Pt reports lower abdominal pain that started today. Pt denies vaginal bleeding or vaginal d/c. LMP 12/20/2017, pt last saw OBGYN on Thursday, she says they called her today and told her to have her levels redrawn on Tuesday. Pt reports prev ectopic pregnancy.

## 2018-01-25 NOTE — ED Notes (Signed)
Pt never in room 9 or seen by this RN

## 2018-01-26 ENCOUNTER — Encounter (HOSPITAL_COMMUNITY): Payer: Self-pay | Admitting: *Deleted

## 2018-01-26 ENCOUNTER — Inpatient Hospital Stay (HOSPITAL_COMMUNITY): Payer: Medicare Other

## 2018-01-26 DIAGNOSIS — Z3A01 Less than 8 weeks gestation of pregnancy: Secondary | ICD-10-CM

## 2018-01-26 DIAGNOSIS — R109 Unspecified abdominal pain: Secondary | ICD-10-CM

## 2018-01-26 DIAGNOSIS — Z8759 Personal history of other complications of pregnancy, childbirth and the puerperium: Secondary | ICD-10-CM

## 2018-01-26 DIAGNOSIS — O26891 Other specified pregnancy related conditions, first trimester: Secondary | ICD-10-CM

## 2018-01-26 LAB — URINALYSIS, ROUTINE W REFLEX MICROSCOPIC
BILIRUBIN URINE: NEGATIVE
GLUCOSE, UA: NEGATIVE mg/dL
HGB URINE DIPSTICK: NEGATIVE
Ketones, ur: 20 mg/dL — AB
Leukocytes, UA: NEGATIVE
Nitrite: NEGATIVE
Protein, ur: NEGATIVE mg/dL
SPECIFIC GRAVITY, URINE: 1.009 (ref 1.005–1.030)
pH: 6 (ref 5.0–8.0)

## 2018-01-26 LAB — CBC
HEMATOCRIT: 35.7 % — AB (ref 36.0–46.0)
Hemoglobin: 11.9 g/dL — ABNORMAL LOW (ref 12.0–15.0)
MCH: 31 pg (ref 26.0–34.0)
MCHC: 33.3 g/dL (ref 30.0–36.0)
MCV: 93 fL (ref 80.0–100.0)
Platelets: 371 10*3/uL (ref 150–400)
RBC: 3.84 MIL/uL — ABNORMAL LOW (ref 3.87–5.11)
RDW: 13.8 % (ref 11.5–15.5)
WBC: 10 10*3/uL (ref 4.0–10.5)
nRBC: 0 % (ref 0.0–0.2)

## 2018-01-26 LAB — WET PREP, GENITAL
SPERM: NONE SEEN
Trich, Wet Prep: NONE SEEN
Yeast Wet Prep HPF POC: NONE SEEN

## 2018-01-26 LAB — HCG, QUANTITATIVE, PREGNANCY: hCG, Beta Chain, Quant, S: 7147 m[IU]/mL — ABNORMAL HIGH (ref <5)

## 2018-01-26 MED ORDER — HYDROXYZINE HCL 25 MG PO TABS: 25.0000 mg | ORAL_TABLET | Freq: Three times a day (TID) | ORAL | 2 refills | Status: DC | PRN

## 2018-01-26 NOTE — Progress Notes (Signed)
Lisa Leftwich-Kirby CNM in earlier to discuss test results and d/c plan. Written and verbal d/c instructions given and understanding voiced. 

## 2018-01-26 NOTE — MAU Note (Signed)
Pt has hx ectopic with R tube removed. Had positive upt 01/22/18. Had quant done [facility] couple days ago. Had some L shoulder pain today and googled it and saw could be ectopic. No vag bleeding. Some abd cramping.

## 2018-01-26 NOTE — MAU Provider Note (Signed)
Chief Complaint: Abdominal Pain and Shoulder Pain   First Provider Initiated Contact with Patient 01/26/18 0140      SUBJECTIVE HPI: Alyssa Short is a 37 y.o. [id number] at [redacted]w[redacted]d by LMP who presents to maternity admissions reporting positive home pregnancy test and labwork at [facility] office with history of ectopic pregnancy with salpingectomy. Today she started having pain in her left shoulder and upper back and researched her symptoms online. She became worried that shoulder pain can be associated with ectopic pregnancy.  Over the last few hours she does report some mild intermittent low abdominal cramping.  This has not required treatment. There are no other symptoms.   She denies vaginal bleeding, vaginal itching/burning, urinary symptoms, h/a, dizziness, n/v, or fever/chills.     HPI  Past Medical History:  Diagnosis Date  . Anxiety   . Blood transfusion without reported diagnosis   . Cancer ([place])    Leukemia APO  . Hypertension    Past Surgical History:  Procedure Laterality Date  . ECTOPIC PREGNANCY SURGERY    . INDUCED ABORTION     Social History   Socioeconomic History  . Marital status: Significant Other    Spouse name: Not on file  . Number of children: Not on file  . Years of education: Not on file  . Highest education level: Not on file  Occupational History  . Not on file  Social Needs  . Financial resource strain: Not on file  . Food insecurity:    Worry: Not on file    Inability: Not on file  . Transportation needs:    Medical: Not on file    Non-medical: Not on file  Tobacco Use  . Smoking status: Former Research scientist (life sciences)  . Smokeless tobacco: Never Used  Substance and Sexual Activity  . Alcohol use: Yes    Comment: rarely- red wine  . Drug use: Not Currently    Types: Marijuana  . Sexual activity: Not on file  Lifestyle  . Physical activity:    Days per week: Not on file    Minutes per session: Not on file  . Stress: Not on file  Relationships  . Social  connections:    Talks on phone: Not on file    Gets together: Not on file    Attends religious service: Not on file    Active member of club or organization: Not on file    Attends meetings of clubs or organizations: Not on file    Relationship status: Not on file  . Intimate partner violence:    Fear of current or ex partner: Not on file    Emotionally abused: Not on file    Physically abused: Not on file    Forced sexual activity: Not on file  Other Topics Concern  . Not on file  Social History Narrative  . Not on file   No current facility-administered medications on file prior to encounter.    Current Outpatient Medications on File Prior to Encounter  Medication Sig Dispense Refill  . diazepam (VALIUM) 2 MG tablet Take 2 mg by mouth every 6 (six) hours as needed for anxiety.    Marland Kitchen NIFEdipine (PROCARDIA-XL/NIFEDICAL-XL) 30 MG 24 hr tablet Take 30 mg by mouth daily.    Marland Kitchen ALPRAZolam (XANAX) 0.25 MG tablet Take 1 tablet (0.25 mg total) by mouth 3 (three) times daily as needed for anxiety. 10 tablet 0  . hydrocortisone cream 1 % Apply to affected area 2 times daily 15 g  0  . hydrOXYzine (ATARAX/VISTARIL) 25 MG tablet Take 1 tablet (25 mg total) by mouth at bedtime and may repeat dose one time if needed. 12 tablet 0  . potassium chloride SA (K-DUR,KLOR-CON) 20 MEQ tablet Take 1 tablet (20 mEq total) by mouth daily. 3 tablet 0  . pramoxine (PROCTOFOAM) 1 % foam Place 1 application rectally 3 (three) times daily as needed for anal itching. 15 g 0  . triamcinolone cream (KENALOG) 0.1 % Apply 1 application topically 2 (two) times daily. 30 g 0   No Known Allergies  ROS:  Review of Systems  Constitutional: Negative for chills, fatigue and fever.  Respiratory: Negative for shortness of breath.   Cardiovascular: Negative for chest pain.  Gastrointestinal: Positive for abdominal pain.  Genitourinary: Positive for pelvic pain. Negative for difficulty urinating, dysuria, flank pain, vaginal  bleeding, vaginal discharge and vaginal pain.  Musculoskeletal: Positive for arthralgias.  Neurological: Negative for dizziness and headaches.  Psychiatric/Behavioral: Negative.      I have reviewed patient's Past Medical Hx, Surgical Hx, Family Hx, Social Hx, medications and allergies.   Physical Exam   Patient Vitals for the past 24 hrs:  BP Temp Pulse Resp Height Weight  01/26/18 0009 132/70 - (!) 119 18 - -  01/26/18 0002 - 97.8 F (36.6 C) - - 5\' 2"  (1.575 m) 100.2 kg   Constitutional: Well-developed, well-nourished female in no acute distress.  Cardiovascular: normal rate Respiratory: normal effort GI: Abd soft, non-tender. Pos BS x 4 MS: Extremities nontender, no edema, normal ROM Neurologic: Alert and oriented x 4.  GU: Neg CVAT.  PELVIC EXAM: Wet prep/GCC collected by blind swab   LAB RESULTS Results for orders placed or performed during the hospital encounter of 01/25/18 (from the past 24 hour(s))  CBC     Status: Abnormal   Collection Time: 01/26/18  1:51 AM  Result Value Ref Range   WBC 10.0 4.0 - 10.5 K/uL   RBC 3.84 (L) 3.87 - 5.11 MIL/uL   Hemoglobin 11.9 (L) 12.0 - 15.0 g/dL   HCT 35.7 (L) 36.0 - 46.0 %   MCV 93.0 80.0 - 100.0 fL   MCH 31.0 26.0 - 34.0 pg   MCHC 33.3 30.0 - 36.0 g/dL   RDW 13.8 11.5 - 15.5 %   Platelets 371 150 - 400 K/uL   nRBC 0.0 0.0 - 0.2 %  hCG, quantitative, pregnancy     Status: Abnormal   Collection Time: 01/26/18  1:51 AM  Result Value Ref Range   hCG, Beta Chain, Quant, S 7,147 (H) <5 mIU/mL  Urinalysis, Routine w reflex microscopic     Status: Abnormal   Collection Time: 01/26/18  2:05 AM  Result Value Ref Range   Color, Urine YELLOW YELLOW   APPearance CLEAR CLEAR   Specific Gravity, Urine 1.009 1.005 - 1.030   pH 6.0 5.0 - 8.0   Glucose, UA NEGATIVE NEGATIVE mg/dL   Hgb urine dipstick NEGATIVE NEGATIVE   Bilirubin Urine NEGATIVE NEGATIVE   Ketones, ur 20 (A) NEGATIVE mg/dL   Protein, ur NEGATIVE NEGATIVE mg/dL    Nitrite NEGATIVE NEGATIVE   Leukocytes, UA NEGATIVE NEGATIVE  Wet prep, genital     Status: Abnormal   Collection Time: 01/26/18  2:05 AM  Result Value Ref Range   Yeast Wet Prep HPF POC NONE SEEN NONE SEEN   Trich, Wet Prep NONE SEEN NONE SEEN   Clue Cells Wet Prep HPF POC PRESENT (A) NONE SEEN   WBC,  Wet Prep HPF POC FEW (A) NONE SEEN   Sperm NONE SEEN        IMAGING No results found.  MAU Management/MDM: Ordered labs and [place] and reviewed results.  [place] indicates early IUP today with gestational sac and yolk sac.  Pt may begin prenatal care with provider of her choice, list provided.  Pt discharged with strict ectopic precautions.  ASSESSMENT 1. Normal IUP (intrauterine pregnancy) on prenatal ultrasound, first trimester   2. History of ectopic pregnancy   3. Abdominal pain during pregnancy in first trimester     PLAN Discharge home Allergies as of 01/26/2018   No Known Allergies     Medication List    STOP taking these medications   ALPRAZolam 0.25 MG tablet Commonly known as:  XANAX   diazepam 2 MG tablet Commonly known as:  VALIUM     TAKE these medications   hydrocortisone cream 1 % Apply to affected area 2 times daily   hydrOXYzine 25 MG tablet Commonly known as:  ATARAX/VISTARIL Take 1 tablet (25 mg total) by mouth at bedtime and may repeat dose one time if needed.   NIFEdipine 30 MG 24 hr tablet Commonly known as:  PROCARDIA-XL/NIFEDICAL-XL Take 30 mg by mouth daily.   potassium chloride SA 20 MEQ tablet Commonly known as:  K-DUR,KLOR-CON Take 1 tablet (20 mEq total) by mouth daily.   pramoxine 1 % foam Commonly known as:  PROCTOFOAM Place 1 application rectally 3 (three) times daily as needed for anal itching.   triamcinolone cream 0.1 % Commonly known as:  KENALOG Apply 1 application topically 2 (two) times daily.      Follow-up Information    Prenatal provider of your choice Follow up.   Why:  See list provided          Fatima Blank Certified Nurse-Midwife 01/26/2018  3:34 AM

## 2018-01-26 NOTE — Discharge Instructions (Signed)
[facility]  ° ° °[facility]       Phone: [phone] ° °[facility] Phone: [phone] ° °[facility]  Phone: [phone] ° °[facility]  Phone: [phone] ° °[facility]  Phone: [phone] ° °[facility]       Phone: [phone] ° °[facility]    Phone: [phone]  ° °[facility]    Phone: [phone] ° °[facility]    Phone: [phone] ° °[facility]    Phone: [phone] ° °[facility]    Phone: [phone] ° °[facility]       Phone: [phone]  ° °[facility]  Phone: [phone] ° °[facility]    Phone: [phone] ° °[facility]   Phone: [phone] ° °[facility]      Phone: [phone] ° °[facility]    Phone: [phone] ° °

## 2018-01-28 LAB — GC/CHLAMYDIA PROBE AMP (~~LOC~~) NOT AT ARMC
CHLAMYDIA, DNA PROBE: NEGATIVE
NEISSERIA GONORRHEA: NEGATIVE

## 2018-08-28 DIAGNOSIS — Z3689 Encounter for other specified antenatal screening: Secondary | ICD-10-CM

## 2018-08-28 DIAGNOSIS — Z6841 Body Mass Index (BMI) 40.0 and over, adult: Secondary | ICD-10-CM | POA: Insufficient documentation

## 2018-08-28 HISTORY — DX: Encounter for other specified antenatal screening: Z36.89

## 2018-12-19 ENCOUNTER — Encounter (HOSPITAL_COMMUNITY): Payer: Self-pay

## 2018-12-20 ENCOUNTER — Encounter (HOSPITAL_BASED_OUTPATIENT_CLINIC_OR_DEPARTMENT_OTHER): Payer: Self-pay | Admitting: *Deleted

## 2018-12-20 ENCOUNTER — Other Ambulatory Visit: Payer: Self-pay

## 2018-12-20 ENCOUNTER — Emergency Department (HOSPITAL_BASED_OUTPATIENT_CLINIC_OR_DEPARTMENT_OTHER)
Admission: EM | Admit: 2018-12-20 | Discharge: 2018-12-20 | Disposition: A | Discharge status: Home / Self Care | Payer: Medicare Other | Attending: Emergency Medicine | Admitting: Emergency Medicine

## 2018-12-20 DIAGNOSIS — Z856 Personal history of leukemia: Secondary | ICD-10-CM | POA: Diagnosis not present

## 2018-12-20 DIAGNOSIS — I10 Essential (primary) hypertension: Secondary | ICD-10-CM | POA: Diagnosis not present

## 2018-12-20 DIAGNOSIS — Z87891 Personal history of nicotine dependence: Secondary | ICD-10-CM | POA: Insufficient documentation

## 2018-12-20 DIAGNOSIS — R42 Dizziness and giddiness: Secondary | ICD-10-CM | POA: Insufficient documentation

## 2018-12-20 DIAGNOSIS — E860 Dehydration: Secondary | ICD-10-CM | POA: Insufficient documentation

## 2018-12-20 LAB — CBC WITH DIFFERENTIAL/PLATELET
Abs Immature Granulocytes: 0.02 10*3/uL (ref 0.00–0.07)
Basophils Absolute: 0 10*3/uL (ref 0.0–0.1)
Basophils Relative: 0 %
Eosinophils Absolute: 0 10*3/uL (ref 0.0–0.5)
Eosinophils Relative: 0 %
HCT: 39.8 % (ref 36.0–46.0)
Hemoglobin: 12.8 g/dL (ref 12.0–15.0)
Immature Granulocytes: 0 %
Lymphocytes Relative: 26 %
Lymphs Abs: 2.3 10*3/uL (ref 0.7–4.0)
MCH: 29 pg (ref 26.0–34.0)
MCHC: 32.2 g/dL (ref 30.0–36.0)
MCV: 90.2 fL (ref 80.0–100.0)
Monocytes Absolute: 0.4 10*3/uL (ref 0.1–1.0)
Monocytes Relative: 5 %
Neutro Abs: 6.1 10*3/uL (ref 1.7–7.7)
Neutrophils Relative %: 69 %
Platelets: 352 10*3/uL (ref 150–400)
RBC: 4.41 MIL/uL (ref 3.87–5.11)
RDW: 15.4 % (ref 11.5–15.5)
WBC: 8.8 10*3/uL (ref 4.0–10.5)
nRBC: 0 % (ref 0.0–0.2)

## 2018-12-20 LAB — COMPREHENSIVE METABOLIC PANEL
ALT: 42 U/L (ref 0–44)
AST: 26 U/L (ref 15–41)
Albumin: 4.6 g/dL (ref 3.5–5.0)
Alkaline Phosphatase: 97 U/L (ref 38–126)
Anion gap: 13 (ref 5–15)
BUN: 10 mg/dL (ref 6–20)
CO2: 21 mmol/L — ABNORMAL LOW (ref 22–32)
Calcium: 9.6 mg/dL (ref 8.9–10.3)
Chloride: 104 mmol/L (ref 98–111)
Creatinine, Ser: 0.6 mg/dL (ref 0.44–1.00)
GFR calc Af Amer: >60 mL/min (ref >60)
GFR calc non Af Amer: >60 mL/min (ref >60)
Glucose, Bld: 101 mg/dL — ABNORMAL HIGH (ref 70–99)
Potassium: 3.5 mmol/L (ref 3.5–5.1)
Sodium: 138 mmol/L (ref 135–145)
Total Bilirubin: 0.3 mg/dL (ref 0.3–1.2)
Total Protein: 8.4 g/dL — ABNORMAL HIGH (ref 6.5–8.1)

## 2018-12-20 LAB — D-DIMER, QUANTITATIVE (NOT AT ARMC): D-Dimer, Quant: 0.38 ug/mL-FEU (ref 0.00–0.50)

## 2018-12-20 MED ORDER — SODIUM CHLORIDE 0.9 % IV BOLUS
500.0000 mL | Freq: Once | INTRAVENOUS | Status: AC
  Administered 2018-12-20: 500 mL via INTRAVENOUS

## 2018-12-20 NOTE — ED Notes (Signed)
Pt on monitor 

## 2018-12-20 NOTE — ED Provider Notes (Signed)
[facility] DEPART[facility] Provider Note   CSN: [id number] Arrival date & time: 12/20/18  1633     History   Chief Complaint Chief Complaint  Patient presents with  . Dizziness    HPI Alyssa Short is a 38 y.o. female presenting for evaluation of back pain, dizziness, and bilateral shoulder sitffness.   Patient states 3 days ago she developed acute sharp mid upper back pain.  No obvious trigger for her symptoms.  Since then, she has been having some intermittent dizziness, worse with going from sitting to standing and with movement of her head.  She also reports stiffness/soreness in both shoulders, although no body aches elsewhere.  She denies fevers, chills, nasal congestion, sore throat, chest pain, shortness of breath, cough, nausea, vomiting, abdominal pain, urinary symptoms, abnormal bowel movements.  She denies sick contacts.  She denies recent travel, surgeries, immobilization, history of cancer, history of previous DVT/PE, or hormone use.  Patient gave birth 2 months ago, is not currently breast-feeding.  Patient states she finished her period last week, it was 4 days but heavy.  She is concerned about her blood counts.  Patient also states she is very anxious when she sees medical providers, is feeling anxious now, but was not earlier.     HPI  Past Medical History:  Diagnosis Date  . Anxiety   . Blood transfusion without reported diagnosis   . Cancer ([place])    Leukemia APO  . Hypertension     There are no active problems to display for this patient.   Past Surgical History:  Procedure Laterality Date  . ECTOPIC PREGNANCY SURGERY    . INDUCED ABORTION       OB History    Gravida  9   Para  2   Term  2   Preterm      AB  6   Living  2     SAB      TAB  5   Ectopic  1   Multiple      Live Births  2            Home Medications    Prior to Admission medications   Medication Sig Start Date End Date Taking? Authorizing  Provider  NIFEdipine (PROCARDIA-XL/NIFEDICAL-XL) 30 MG 24 hr tablet Take 30 mg by mouth daily.   Yes [provider]  hydrocortisone cream 1 % Apply to affected area 2 times daily 01/06/17   Kirichenko, Lahoma Rocker, PA-C  hydrOXYzine (ATARAX/VISTARIL) 25 MG tablet Take 1-2 tablets (25-50 mg total) by mouth 3 (three) times daily as needed. 01/26/18   Leftwich-Kirby, Kathie Dike, CNM  potassium chloride SA (K-DUR,KLOR-CON) 20 MEQ tablet Take 1 tablet (20 mEq total) by mouth daily. 05/16/17   Julianne Rice, MD  pramoxine (PROCTOFOAM) 1 % foam Place 1 application rectally 3 (three) times daily as needed for anal itching. 01/06/17   Kirichenko, Lahoma Rocker, PA-C  triamcinolone cream (KENALOG) 0.1 % Apply 1 application topically 2 (two) times daily. 09/01/16   Janne Napoleon, NP    Family History Family History  Problem Relation Age of Onset  . Cancer Mother   . Diabetes Brother   . Cirrhosis Father     Social History Social History   Tobacco Use  . Smoking status: Former Research scientist (life sciences)  . Smokeless tobacco: Never Used  Substance Use Topics  . Alcohol use: Yes    Comment: rarely- red wine  . Drug use: Not Currently    Types: Marijuana  Allergies   Patient has no known allergies.   Review of Systems Review of Systems  Musculoskeletal: Positive for back pain.  Neurological: Positive for dizziness.  All other systems reviewed and are negative.    Physical Exam Updated Vital Signs BP 107/75   Pulse 90   Temp 98.5 F (36.9 C) (Oral)   Resp 18   Ht 5\' 2"  (1.575 m)   Wt 99.8 kg   LMP 12/14/2018   SpO2 100%   BMI 40.24 kg/m   Physical Exam Vitals signs and nursing note reviewed.  Constitutional:      General: She is not in acute distress.    Appearance: She is well-developed.     Comments: Resting comfortably in the bed in no acute distress  HENT:     Head: Normocephalic and atraumatic.  Eyes:     Extraocular Movements: Extraocular movements intact.     Conjunctiva/sclera:  Conjunctivae normal.     Pupils: Pupils are equal, round, and reactive to light.     Comments: EOMI and PERRLA.  No nystagmus.  Neck:     Musculoskeletal: Normal range of motion and neck supple.     Comments: Moving head easily without signs of meningismus or stiffness.  No tenderness palpation over midline C-spine. Cardiovascular:     Rate and Rhythm: Regular rhythm. Tachycardia present.     Pulses: Normal pulses.     Comments: Tachycardic around 115, patient states this is due to anxiety. Pulmonary:     Effort: Pulmonary effort is normal. No respiratory distress.     Breath sounds: Normal breath sounds. No wheezing.     Comments: Speaking in full sentences.  Clear lung sounds in all fields. Abdominal:     General: There is no distension.     Palpations: Abdomen is soft. There is no mass.     Tenderness: There is no abdominal tenderness. There is no guarding or rebound.  Musculoskeletal: Normal range of motion.     Comments: Strength and sensation intact x4.  No tenderness palpation of the back or midline spine.  No step-offs or deformities.  Skin:    General: Skin is warm and dry.     Capillary Refill: Capillary refill takes less than 2 seconds.  Neurological:     Mental Status: She is alert and oriented to person, place, and time.     GCS: GCS eye subscore is 4. GCS verbal subscore is 5. GCS motor subscore is 6.     Cranial Nerves: Cranial nerves are intact.     Sensory: Sensation is intact.     Motor: Motor function is intact.     Coordination: Coordination is intact. Finger-Nose-Finger Test and Heel to [facility] Test normal.     Comments: No obvious neurologic deficit.  CN intact.  Nose to finger intact.  Coordination intact.      ED Treatments / Results  Labs (all labs ordered are listed, but only abnormal results are displayed) Labs Reviewed  COMPREHENSIVE METABOLIC PANEL - Abnormal; Notable for the following components:      Result Value   CO2 21 (*)    Glucose, Bld 101  (*)    Total Protein 8.4 (*)    All other components within normal limits  CBC WITH DIFFERENTIAL/PLATELET  D-DIMER, QUANTITATIVE (NOT AT [facility])    EKG None  Radiology No results found.  Procedures Procedures (including critical care time)  Medications Ordered in ED Medications  sodium chloride 0.9 % bolus 500 mL (  Intravenous Stopped 12/20/18 1944)     Initial Impression / Assessment and Plan / ED Course  I have reviewed the triage vital signs and the nursing notes.  Pertinent labs & imaging results that were available during my care of the patient were reviewed by me and considered in my medical decision making (see chart for details).        Patient presenting for evaluation of dizziness, back pain, shoulder stiffness.  Physical exam shows patient appears nontoxic.  No obvious neurologic deficits.  Symptoms are worse when she has them sitting to standing and with movement of her head.  As such, consider dehydration.  However, symptoms began with back pain which began suddenly, and patient recently gave birth, consider PE.  No anterior chest pain, shortness of breath, cough, doubt ACS.  Patient is mostly concerned about her blood counts due to her recent period.  Will obtain labs, dimer, and orthostatic vital signs.  Fluids for symptom control.  Orthostatics show slight increase in heart rate from sitting to standing.  Labs reassuring.  Dimer negative.  Electrolytes stable.  Hemoglobin stable.  Heart rate improved with fluids.  On reassessment, patient reports symptoms are completely resolved.  Discussed normal labs and importance of hydration.  Discussed close monitoring and follow-up if symptoms not improving.  Strict return precautions given, including signs of ACS including chest pain.  Doubt COVID, patient without any respiratory symptoms or fever.  Consider MSK cause for back pain and shoulder stiffness.  At this time, patient appears safe for discharge.  Return precautions  given.  Patient states she understands and agrees to plan.  Final Clinical Impressions(s) / ED Diagnoses   Final diagnoses:  Dizziness  Dehydration    ED Discharge Orders    None       Franchot Heidelberg, PA-C 12/20/18 2056    Malvin Johns, MD 12/20/18 2143

## 2018-12-20 NOTE — Discharge Instructions (Addendum)
Make sure you are staying well-hydrated water. Follow-up with your doctor on Monday if your symptoms are not improving. Return to the emergency room if develop high fevers, difficulty breathing, severe worsening chest pain, persistent dizziness, falls due to dizziness, any new, worsening, concerning symptoms.

## 2018-12-20 NOTE — ED Triage Notes (Signed)
Dizziness x 2 days. Headache 3 days ago. Fatigue. Body aches.

## 2018-12-31 DIAGNOSIS — F411 Generalized anxiety disorder: Secondary | ICD-10-CM | POA: Insufficient documentation

## 2019-01-01 DIAGNOSIS — E559 Vitamin D deficiency, unspecified: Secondary | ICD-10-CM | POA: Insufficient documentation

## 2019-01-20 DIAGNOSIS — H02886 Meibomian gland dysfunction of left eye, unspecified eyelid: Secondary | ICD-10-CM | POA: Insufficient documentation

## 2019-01-20 DIAGNOSIS — H02883 Meibomian gland dysfunction of right eye, unspecified eyelid: Secondary | ICD-10-CM | POA: Insufficient documentation

## 2019-01-20 DIAGNOSIS — H04123 Dry eye syndrome of bilateral lacrimal glands: Secondary | ICD-10-CM | POA: Insufficient documentation

## 2019-01-20 HISTORY — DX: Meibomian gland dysfunction of left eye, unspecified eyelid: H02.886

## 2019-01-20 HISTORY — DX: Meibomian gland dysfunction of right eye, unspecified eyelid: H02.883

## 2019-06-16 ENCOUNTER — Ambulatory Visit: Payer: Medicare Other | Attending: Internal Medicine

## 2019-06-16 DIAGNOSIS — Z20822 Contact with and (suspected) exposure to covid-19: Secondary | ICD-10-CM

## 2019-06-17 ENCOUNTER — Telehealth: Payer: Self-pay | Admitting: *Deleted

## 2019-06-17 ENCOUNTER — Telehealth: Payer: Self-pay

## 2019-06-17 NOTE — Telephone Encounter (Signed)
Pt called in requesting result of COVID-19 test.   I let her know it has not been processed.   Since she is active on MyChart I let her know the result would show up there once it's ready.

## 2019-06-17 NOTE — Telephone Encounter (Signed)
Pt informed that covid results are not back yet- advised that she will receive a notification on her MyChart app.

## 2019-06-18 LAB — NOVEL CORONAVIRUS, NAA: SARS-CoV-2, NAA: DETECTED — AB

## 2019-06-18 LAB — SARS-COV-2, NAA 2 DAY TAT

## 2019-06-19 ENCOUNTER — Telehealth (HOSPITAL_COMMUNITY): Payer: Self-pay | Admitting: Nurse Practitioner

## 2019-06-19 ENCOUNTER — Encounter: Payer: Self-pay | Admitting: Nurse Practitioner

## 2019-06-19 NOTE — Telephone Encounter (Signed)
Called to Discuss with patient about Covid symptoms and the use of bamlanivimab, a monoclonal antibody infusion for those with mild to moderate Covid symptoms and at a high risk of hospitalization.     Pt is qualified for this infusion at the [facility] infusion center due to co-morbid conditions and/or a member of an at-risk group.     Unable to reach pt. Voicemail full. Sent [employer].   Beckey Rutter, [place], AGNP-C [phone] ([place])

## 2019-07-21 ENCOUNTER — Ambulatory Visit: Payer: Medicare Other | Attending: Internal Medicine

## 2019-07-21 DIAGNOSIS — Z20822 Contact with and (suspected) exposure to covid-19: Secondary | ICD-10-CM

## 2019-07-22 LAB — NOVEL CORONAVIRUS, NAA: SARS-CoV-2, NAA: NOT DETECTED

## 2019-07-22 LAB — SARS-COV-2, NAA 2 DAY TAT

## 2019-08-16 ENCOUNTER — Encounter (HOSPITAL_COMMUNITY): Payer: Self-pay | Admitting: Obstetrics & Gynecology

## 2019-08-16 ENCOUNTER — Other Ambulatory Visit: Payer: Self-pay

## 2019-08-16 ENCOUNTER — Inpatient Hospital Stay (HOSPITAL_COMMUNITY): Payer: Medicare Other

## 2019-08-16 ENCOUNTER — Inpatient Hospital Stay (HOSPITAL_COMMUNITY)
Admission: AD | Admit: 2019-08-16 | Discharge: 2019-08-16 | Disposition: A | Discharge status: Home / Self Care | Payer: Medicare Other | Attending: Obstetrics & Gynecology | Admitting: Obstetrics & Gynecology

## 2019-08-16 DIAGNOSIS — W109XXA Fall (on) (from) unspecified stairs and steps, initial encounter: Secondary | ICD-10-CM | POA: Diagnosis not present

## 2019-08-16 DIAGNOSIS — Z79899 Other long term (current) drug therapy: Secondary | ICD-10-CM | POA: Insufficient documentation

## 2019-08-16 DIAGNOSIS — Z3A01 Less than 8 weeks gestation of pregnancy: Secondary | ICD-10-CM | POA: Diagnosis not present

## 2019-08-16 DIAGNOSIS — O99891 Other specified diseases and conditions complicating pregnancy: Secondary | ICD-10-CM | POA: Insufficient documentation

## 2019-08-16 DIAGNOSIS — W108XXA Fall (on) (from) other stairs and steps, initial encounter: Secondary | ICD-10-CM

## 2019-08-16 DIAGNOSIS — F419 Anxiety disorder, unspecified: Secondary | ICD-10-CM | POA: Diagnosis not present

## 2019-08-16 DIAGNOSIS — N760 Acute vaginitis: Secondary | ICD-10-CM

## 2019-08-16 DIAGNOSIS — Z8759 Personal history of other complications of pregnancy, childbirth and the puerperium: Secondary | ICD-10-CM | POA: Insufficient documentation

## 2019-08-16 DIAGNOSIS — O26899 Other specified pregnancy related conditions, unspecified trimester: Secondary | ICD-10-CM

## 2019-08-16 DIAGNOSIS — R109 Unspecified abdominal pain: Secondary | ICD-10-CM

## 2019-08-16 DIAGNOSIS — O99340 Other mental disorders complicating pregnancy, unspecified trimester: Secondary | ICD-10-CM | POA: Insufficient documentation

## 2019-08-16 DIAGNOSIS — Z87891 Personal history of nicotine dependence: Secondary | ICD-10-CM | POA: Diagnosis not present

## 2019-08-16 DIAGNOSIS — O10011 Pre-existing essential hypertension complicating pregnancy, first trimester: Secondary | ICD-10-CM | POA: Diagnosis not present

## 2019-08-16 DIAGNOSIS — O09521 Supervision of elderly multigravida, first trimester: Secondary | ICD-10-CM | POA: Diagnosis not present

## 2019-08-16 DIAGNOSIS — O3680X0 Pregnancy with inconclusive fetal viability, not applicable or unspecified: Secondary | ICD-10-CM

## 2019-08-16 DIAGNOSIS — O26891 Other specified pregnancy related conditions, first trimester: Secondary | ICD-10-CM | POA: Diagnosis present

## 2019-08-16 LAB — WET PREP, GENITAL
Sperm: NONE SEEN
Trich, Wet Prep: NONE SEEN
Yeast Wet Prep HPF POC: NONE SEEN

## 2019-08-16 LAB — URINALYSIS, ROUTINE W REFLEX MICROSCOPIC
Bilirubin Urine: NEGATIVE
Glucose, UA: NEGATIVE mg/dL
Hgb urine dipstick: NEGATIVE
Ketones, ur: NEGATIVE mg/dL
Leukocytes,Ua: NEGATIVE
Nitrite: NEGATIVE
Protein, ur: NEGATIVE mg/dL
Specific Gravity, Urine: 1.021 (ref 1.005–1.030)
pH: 7 (ref 5.0–8.0)

## 2019-08-16 LAB — CBC
HCT: 33.6 % — ABNORMAL LOW (ref 36.0–46.0)
Hemoglobin: 10.7 g/dL — ABNORMAL LOW (ref 12.0–15.0)
MCH: 29.9 pg (ref 26.0–34.0)
MCHC: 31.8 g/dL (ref 30.0–36.0)
MCV: 93.9 fL (ref 80.0–100.0)
Platelets: 334 10*3/uL (ref 150–400)
RBC: 3.58 MIL/uL — ABNORMAL LOW (ref 3.87–5.11)
RDW: 14.8 % (ref 11.5–15.5)
WBC: 6.9 10*3/uL (ref 4.0–10.5)
nRBC: 0 % (ref 0.0–0.2)

## 2019-08-16 LAB — POCT PREGNANCY, URINE: Preg Test, Ur: POSITIVE — AB

## 2019-08-16 LAB — ABO/RH: ABO/RH(D): O POS

## 2019-08-16 LAB — HCG, QUANTITATIVE, PREGNANCY: hCG, Beta Chain, Quant, S: 1324 m[IU]/mL — ABNORMAL HIGH (ref <5)

## 2019-08-16 MED ORDER — CYCLOBENZAPRINE HCL 5 MG PO TABS
10.0000 mg | ORAL_TABLET | Freq: Once | ORAL | Status: AC
  Administered 2019-08-16: 10 mg via ORAL
  Filled 2019-08-16: qty 2

## 2019-08-16 MED ORDER — METRONIDAZOLE 500 MG PO TABS
500.0000 mg | ORAL_TABLET | Freq: Two times a day (BID) | ORAL | 0 refills | Status: DC
Start: 2019-08-16 — End: 2019-08-30

## 2019-08-16 NOTE — MAU Note (Signed)
Pt reports to MAU stating she experienced a fall last night around 2300. Pt states she fell down 7 steps. Pt denies any head or abdominal trauma. Pt states she is having back pain and abdominal cramping that has been ongoing all day. Pt has tried heat with slight relief. Pt has not taken any medications at this time. Pt denies vaginal bleeding or discharge.

## 2019-08-16 NOTE — MAU Provider Note (Signed)
History     CSN: [id number]  Arrival date and time: 08/16/19 2101   First Provider Initiated Contact with Patient 08/16/19 2207      Chief Complaint  Patient presents with  . Fall  . Abdominal Pain  . Back Pain   Alyssa Short is a 39 y.o. [id number] at [redacted]w[redacted]d by Definite LMP of May 7th who will be seen at [facility].  She presents today for Fall, Abdominal Pain, and Back Pain.  She states she fell down the stairs about 7 steps while trying to go to the car.  Patient states she was able to brace herself and fall on her back.  She states she had some discomfort last night and used a hot pack, but the pain has progressed throughout the day. Patient describes the pain as "cramping that comes and goes."  She rates the pain a 6-7/10. She denies any aggravating factors regarding her pain.       OB History    Gravida  10   Para  3   Term  3   Preterm      AB  6   Living  3     SAB      TAB  5   Ectopic  1   Multiple      Live Births  3           Past Medical History:  Diagnosis Date  . Anxiety   . Blood transfusion without reported diagnosis   . Cancer ([place])    Leukemia APO  . Hypertension     Past Surgical History:  Procedure Laterality Date  . ECTOPIC PREGNANCY SURGERY    . INDUCED ABORTION      Family History  Problem Relation Age of Onset  . Cancer Mother   . Diabetes Brother   . Cirrhosis Father     Social History   Tobacco Use  . Smoking status: Former Research scientist (life sciences)  . Smokeless tobacco: Never Used  Vaping Use  . Vaping Use: Never used  Substance Use Topics  . Alcohol use: Not Currently    Comment: rarely- red wine  . Drug use: Not Currently    Types: Marijuana    Allergies: No Known Allergies  Medications Prior to Admission  Medication Sig Dispense Refill Last Dose  . prenatal vitamin w/FE, FA (PRENATAL 1 + 1) 27-1 MG TABS tablet Take 1 tablet by mouth daily at 12 noon.   08/16/2019 at Unknown time  . hydrocortisone cream 1 % Apply  to affected area 2 times daily 15 g 0   . hydrOXYzine (ATARAX/VISTARIL) 25 MG tablet Take 1-2 tablets (25-50 mg total) by mouth 3 (three) times daily as needed. 90 tablet 2   . NIFEdipine (PROCARDIA-XL/NIFEDICAL-XL) 30 MG 24 hr tablet Take 30 mg by mouth daily.     . potassium chloride SA (K-DUR,KLOR-CON) 20 MEQ tablet Take 1 tablet (20 mEq total) by mouth daily. 3 tablet 0   . pramoxine (PROCTOFOAM) 1 % foam Place 1 application rectally 3 (three) times daily as needed for anal itching. 15 g 0   . triamcinolone cream (KENALOG) 0.1 % Apply 1 application topically 2 (two) times daily. 30 g 0     Review of Systems  Respiratory: Negative for cough and shortness of breath.   Gastrointestinal: Positive for abdominal pain and constipation. Negative for diarrhea, nausea and vomiting.  Genitourinary: Positive for pelvic pain. Negative for difficulty urinating, dysuria, vaginal bleeding and vaginal discharge.  Neurological:  Negative for dizziness, light-headedness and headaches.   Physical Exam   Blood pressure 135/69, pulse (!) 104, temperature 98.4 F (36.9 C), temperature source Oral, resp. rate 18, weight 105.1 kg, last menstrual period 07/11/2019, unknown if currently breastfeeding.  Physical Exam  Constitutional: She is oriented to person, place, and time. She appears well-developed.  HENT:  Head: Normocephalic and atraumatic.  Cardiovascular: Normal rate, regular rhythm and normal heart sounds.  Respiratory: Effort normal and breath sounds normal. No respiratory distress.  GI: Soft. Bowel sounds are normal. There is abdominal tenderness in the right lower quadrant and left lower quadrant.  Neurological: She is alert and oriented to person, place, and time.  Skin: Skin is warm and dry.    MAU Course  Procedures Results for orders placed or performed during the hospital encounter of 08/16/19 (from the past 24 hour(s))  Urinalysis, Routine w reflex microscopic     Status: Abnormal    Collection Time: 08/16/19  9:08 PM  Result Value Ref Range   Color, Urine YELLOW YELLOW   APPearance HAZY (A) CLEAR   Specific Gravity, Urine 1.021 1.005 - 1.030   pH 7.0 5.0 - 8.0   Glucose, UA NEGATIVE NEGATIVE mg/dL   Hgb urine dipstick NEGATIVE NEGATIVE   Bilirubin Urine NEGATIVE NEGATIVE   Ketones, ur NEGATIVE NEGATIVE mg/dL   Protein, ur NEGATIVE NEGATIVE mg/dL   Nitrite NEGATIVE NEGATIVE   Leukocytes,Ua NEGATIVE NEGATIVE  Pregnancy, urine POC     Status: Abnormal   Collection Time: 08/16/19  9:12 PM  Result Value Ref Range   Preg Test, Ur POSITIVE (A) NEGATIVE  Wet prep, genital     Status: Abnormal   Collection Time: 08/16/19 10:29 PM   Specimen: PATH Cytology Cervicovaginal Ancillary Only  Result Value Ref Range   Yeast Wet Prep HPF POC NONE SEEN NONE SEEN   Trich, Wet Prep NONE SEEN NONE SEEN   Clue Cells Wet Prep HPF POC PRESENT (A) NONE SEEN   WBC, Wet Prep HPF POC MODERATE (A) NONE SEEN   Sperm NONE SEEN   hCG, quantitative, pregnancy     Status: Abnormal   Collection Time: 08/16/19 10:33 PM  Result Value Ref Range   hCG, Beta Chain, Quant, S 1,324 (H) <5 mIU/mL  CBC     Status: Abnormal   Collection Time: 08/16/19 10:33 PM  Result Value Ref Range   WBC 6.9 4.0 - 10.5 K/uL   RBC 3.58 (L) 3.87 - 5.11 MIL/uL   Hemoglobin 10.7 (L) 12.0 - 15.0 g/dL   HCT 33.6 (L) 36 - 46 %   MCV 93.9 80.0 - 100.0 fL   MCH 29.9 26.0 - 34.0 pg   MCHC 31.8 30.0 - 36.0 g/dL   RDW 14.8 11.5 - 15.5 %   Platelets 334 150 - 400 K/uL   nRBC 0.0 0.0 - 0.2 %  ABO/Rh     Status: None   Collection Time: 08/16/19 10:33 PM  Result Value Ref Range   ABO/RH(D) O POS    No rh immune globuloin      NOT A RH IMMUNE GLOBULIN CANDIDATE, PT RH POSITIVE Performed at [facility], [address] [place], [place] [address]    US OB LESS THAN 14 WEEKS WITH OB TRANSVAGINAL  Result Date: 08/16/2019 CLINICAL DATA:  Recent fall with pelvic pain, positive pregnancy test, initial encounter EXAM:  OBSTETRIC <14 WK [place] AND TRANSVAGINAL OB US TECHNIQUE: Both transabdominal and transvaginal ultrasound examinations were performed for complete  evaluation of the gestation as well as the maternal uterus, adnexal regions, and pelvic cul-de-sac. Transvaginal technique was performed to assess early pregnancy. COMPARISON:  None. FINDINGS: Intrauterine gestational sac: Present Yolk sac:  Not definitely present Embryo:  Absent MSD: 4.9 mm 5 w   1 d Subchorionic hemorrhage:  None visualized. Maternal uterus/adnexae: Multiple small fibroids are noted within the uterus. The largest of these measures 1.6 cm posteriorly. IMPRESSION: Probable early intrauterine gestational sac, but no yolk sac, fetal pole, or cardiac activity yet visualized. Recommend follow-up quantitative B-HCG levels and follow-up US in 14 days to assess viability. This recommendation follows SRU consensus guidelines: Diagnostic Criteria for Nonviable Pregnancy Early in the First Trimester. [place] Med 2013; 371:0626-94. Multiple small uterine fibroids. Electronically Signed   By: Inez Catalina M.D.   On: 08/16/2019 23:12    MDM Pelvic Exam; Wet Prep and GC/CT Labs: UA, UPT, CBC, hCG, ABO Ultrasound   Assessment and Plan  39 year old [id number] at 5.1 weeks Abdominal Pain S/P Fall  -POC Reviewed. -Cultures to be collected by nurse. -Will give flexeril for pain. -Labs as above -Will send for [place] and await results.   Maryann Conners 08/16/2019, 10:07 PM   Reassessment (11:28 PM) Bacterial vaginosis No IUGS or YS HCG 1324  -Results as above -Provider to bedside and results discussed.  -Informed of need for follow up in 48-56 hours for repeat quant.  Patient agreeable. -Patient scheduled for 0830 appt at [facility]. -Patient reports history of ectopic pregnancy.  Informed that pregnancy currently considered unknown and next quant will help determine viability. -Patient verbalized understanding and has no other questions or  concerns. -Encouraged to call or return to MAU if symptoms worsen or with the onset of new symptoms. -Discharged to home in stable condition.

## 2019-08-18 LAB — GC/CHLAMYDIA PROBE AMP (~~LOC~~) NOT AT ARMC
Chlamydia: NEGATIVE
Comment: NEGATIVE
Comment: NORMAL
Neisseria Gonorrhea: NEGATIVE

## 2019-08-19 ENCOUNTER — Other Ambulatory Visit: Payer: Self-pay

## 2019-08-19 ENCOUNTER — Ambulatory Visit (INDEPENDENT_AMBULATORY_CARE_PROVIDER_SITE_OTHER): Payer: Medicare Other | Admitting: General Practice

## 2019-08-19 DIAGNOSIS — O3680X0 Pregnancy with inconclusive fetal viability, not applicable or unspecified: Secondary | ICD-10-CM

## 2019-08-19 LAB — BETA HCG QUANT (REF LAB): hCG Quant: 1370 m[IU]/mL

## 2019-08-19 NOTE — Progress Notes (Signed)
Chart reviewed for nurse visit. Agree with plan of care.   Carolanne Mercier L, DO 08/19/2019 2:52 PM

## 2019-08-19 NOTE — Progress Notes (Signed)
Patient presents to office today for stat bhcg following up from MAU visit on 6/12. Patient denies pain or bleeding since visit. Discussed with patient we are monitoring your bhcg levels today, results take approximately 2 hours to finalize and will be reviewed with a provider in the office- we will then call you with results/updated plan of care. Patient verbalized understanding & provided call back number [phone].   Reviewed results with Dr Darene Lamer who finds inappropriate rise in bhcg levles- patient should return Friday for repeat bhcg.   Called patient and discussed results with her including SAB or ectopic pregnancy could not be ruled out at this time. Patient will return Friday 6/18 @ 8:30am for stat bhcg. Ectopic precautions reviewed.   Koren Bound RN BSN 08/19/19

## 2019-08-22 ENCOUNTER — Ambulatory Visit: Payer: Medicare Other

## 2019-08-30 ENCOUNTER — Other Ambulatory Visit: Payer: Self-pay

## 2019-08-30 ENCOUNTER — Inpatient Hospital Stay (HOSPITAL_COMMUNITY): Payer: Medicare Other

## 2019-08-30 ENCOUNTER — Inpatient Hospital Stay (HOSPITAL_COMMUNITY)
Admission: AD | Admit: 2019-08-30 | Discharge: 2019-08-30 | Disposition: A | Discharge status: Home / Self Care | Payer: Medicare Other | Attending: Obstetrics and Gynecology | Admitting: Obstetrics and Gynecology

## 2019-08-30 ENCOUNTER — Encounter (HOSPITAL_COMMUNITY): Payer: Self-pay | Admitting: Obstetrics and Gynecology

## 2019-08-30 DIAGNOSIS — Z87891 Personal history of nicotine dependence: Secondary | ICD-10-CM | POA: Diagnosis not present

## 2019-08-30 DIAGNOSIS — Z3A01 Less than 8 weeks gestation of pregnancy: Secondary | ICD-10-CM | POA: Diagnosis not present

## 2019-08-30 DIAGNOSIS — Z79899 Other long term (current) drug therapy: Secondary | ICD-10-CM | POA: Diagnosis not present

## 2019-08-30 DIAGNOSIS — O039 Complete or unspecified spontaneous abortion without complication: Secondary | ICD-10-CM

## 2019-08-30 DIAGNOSIS — O3411 Maternal care for benign tumor of corpus uteri, first trimester: Secondary | ICD-10-CM | POA: Insufficient documentation

## 2019-08-30 DIAGNOSIS — O09291 Supervision of pregnancy with other poor reproductive or obstetric history, first trimester: Secondary | ICD-10-CM | POA: Insufficient documentation

## 2019-08-30 DIAGNOSIS — Z8759 Personal history of other complications of pregnancy, childbirth and the puerperium: Secondary | ICD-10-CM | POA: Insufficient documentation

## 2019-08-30 DIAGNOSIS — D259 Leiomyoma of uterus, unspecified: Secondary | ICD-10-CM | POA: Insufficient documentation

## 2019-08-30 DIAGNOSIS — O209 Hemorrhage in early pregnancy, unspecified: Secondary | ICD-10-CM

## 2019-08-30 LAB — URINALYSIS, ROUTINE W REFLEX MICROSCOPIC
Bilirubin Urine: NEGATIVE
Glucose, UA: NEGATIVE mg/dL
Hgb urine dipstick: NEGATIVE
Ketones, ur: NEGATIVE mg/dL
Leukocytes,Ua: NEGATIVE
Nitrite: NEGATIVE
Protein, ur: NEGATIVE mg/dL
Specific Gravity, Urine: 1.02 (ref 1.005–1.030)
pH: 6 (ref 5.0–8.0)

## 2019-08-30 MED ORDER — ONDANSETRON 8 MG PO TBDP
8.0000 mg | ORAL_TABLET | Freq: Three times a day (TID) | ORAL | 0 refills | Status: DC | PRN
Start: 2019-08-30 — End: 2020-07-14

## 2019-08-30 MED ORDER — FERROUS SULFATE 325 (65 FE) MG PO TABS: 325.0000 mg | ORAL_TABLET | Freq: Every day | ORAL | 3 refills | Status: AC

## 2019-08-30 MED ORDER — OXYCODONE-ACETAMINOPHEN 5-325 MG PO TABS: 1.0000 | ORAL_TABLET | Freq: Four times a day (QID) | ORAL | 0 refills | Status: DC | PRN

## 2019-08-30 MED ORDER — MISOPROSTOL 200 MCG PO TABS: 800.0000 ug | ORAL_TABLET | Freq: Once | ORAL | 0 refills | Status: AC

## 2019-08-30 NOTE — MAU Note (Signed)
Alyssa Short is a 39 y.o. at [redacted]w[redacted]d here in MAU reporting:  Vaginal bleeding. Brown in [employer] Has changed to red streaks today when she wipes.  Called the on call and they advised her to come in.  +nausea  States she was following HCG's here; went to [facility] OB ([facility]) for a second opinion and reports that she had an ultrasound with a heartbeat.   Pain score: denies Vitals:   08/30/19 1301  BP: 127/77  Pulse: 96  Resp: 18  Temp: 98.7 F (37.1 C)  SpO2: 99%     Lab orders placed from triage: ua

## 2019-08-30 NOTE — MAU Provider Note (Signed)
History     CSN: [id number]  Arrival date and time: 08/30/19 1246   First Provider Initiated Contact with Patient 08/30/19 1348      Chief Complaint  Patient presents with  . Vaginal Bleeding  . Nausea   Alyssa Short is a 39 y.o. [id number] at [redacted]w[redacted]d who presents today with vaginal bleeding. She reports that this started yesterday and has been off and on. She is not bleeding right now. She was seen here for [facility] and they were not rising. She had an appt with her OB, and had an US done at that time. [place] dated 08/21/2019 is seen in care everywhere. Patient has copy of the report that shows 5 week 5 days IUP with cardiac activity.   Vaginal Bleeding The patient's primary symptoms include vaginal bleeding. This is a new problem. The current episode started yesterday. The problem occurs intermittently. The problem has been resolved. She is pregnant. The vaginal discharge was bloody and brown. The vaginal bleeding is spotting. She has not been passing clots. She has not been passing tissue. Nothing aggravates the symptoms. She has tried nothing for the symptoms.    OB History    Gravida  10   Para  3   Term  3   Preterm      AB  6   Living  3     SAB      TAB  5   Ectopic  1   Multiple      Live Births  3           Past Medical History:  Diagnosis Date  . Anxiety   . Blood transfusion without reported diagnosis   . Cancer ([place])    Leukemia APO  . Hypertension     Past Surgical History:  Procedure Laterality Date  . ECTOPIC PREGNANCY SURGERY    . INDUCED ABORTION      Family History  Problem Relation Age of Onset  . Cancer Mother   . Diabetes Brother   . Cirrhosis Father     Social History   Tobacco Use  . Smoking status: Former Research scientist (life sciences)  . Smokeless tobacco: Never Used  Vaping Use  . Vaping Use: Never used  Substance Use Topics  . Alcohol use: Not Currently    Comment: rarely- red wine  . Drug use: Not Currently    Types: Marijuana    Allergies:  No Known Allergies  Medications Prior to Admission  Medication Sig Dispense Refill Last Dose  . metroNIDAZOLE (FLAGYL) 500 MG tablet Take 1 tablet (500 mg total) by mouth 2 (two) times daily. 14 tablet 0 Past Month at Unknown time  . prenatal vitamin w/FE, FA (PRENATAL 1 + 1) 27-1 MG TABS tablet Take 1 tablet by mouth daily at 12 noon.   08/30/2019 at Unknown time    Review of Systems  Genitourinary: Positive for vaginal bleeding.   Physical Exam   Blood pressure 127/77, pulse 96, temperature 98.7 F (37.1 C), temperature source Oral, resp. rate 18, weight 104.4 kg, last menstrual period 07/11/2019, SpO2 99 %, unknown if currently breastfeeding.  Physical Exam Vitals and nursing note reviewed. Exam conducted with a chaperone present.  Constitutional:      Appearance: Normal appearance.  HENT:     Head: Normocephalic.  Cardiovascular:     Rate and Rhythm: Normal rate.  Pulmonary:     Effort: Pulmonary effort is normal.  Abdominal:     General: Abdomen is flat.  Palpations: Abdomen is soft.     Tenderness: There is no abdominal tenderness.  Skin:    General: Skin is warm and dry.  Neurological:     General: No focal deficit present.     Mental Status: She is alert.  Psychiatric:        Mood and Affect: Mood normal.        Behavior: Behavior normal.    Results for orders placed or performed during the hospital encounter of 08/30/19 (from the past 24 hour(s))  Urinalysis, Routine w reflex microscopic     Status: None   Collection Time: 08/30/19  1:26 PM  Result Value Ref Range   Color, Urine YELLOW YELLOW   APPearance CLEAR CLEAR   Specific Gravity, Urine 1.020 1.005 - 1.030   pH 6.0 5.0 - 8.0   Glucose, UA NEGATIVE NEGATIVE mg/dL   Hgb urine dipstick NEGATIVE NEGATIVE   Bilirubin Urine NEGATIVE NEGATIVE   Ketones, ur NEGATIVE NEGATIVE mg/dL   Protein, ur NEGATIVE NEGATIVE mg/dL   Nitrite NEGATIVE NEGATIVE   Leukocytes,Ua NEGATIVE NEGATIVE   US OB  Transvaginal  Addendum Date: 08/30/2019   ADDENDUM REPORT: 08/30/2019 15:14 COMPARISON:  08/16/2019 Electronically Signed   By: Rolm Baptise M.D.   On: 08/30/2019 15:14   Result Date: 08/30/2019 CLINICAL DATA:  Vaginal bleeding in 1st trimester EXAM: TRANSVAGINAL OB ULTRASOUND TECHNIQUE: Transvaginal ultrasound was performed for complete evaluation of the gestation as well as the maternal uterus, adnexal regions, and pelvic cul-de-sac. COMPARISON:  None. FINDINGS: Intrauterine gestational sac: Single Yolk sac:  Visualized Embryo:  Visualized Cardiac Activity: Not visualized Heart Rate:  bpm MSD: 11.1 mm   5 w   6 d CRL:   3.0 mm   5 w 6 d                  [place] EDC: 05/03/2020 Subchorionic hemorrhage:  None visualized. Maternal uterus/adnexae: Small fibroids, the largest left posterior measuring 1.4 cm. No adnexal mass or free fluid. IMPRESSION: Five week 6 day intrauterine pregnancy. No fetal heart tones detected. There has been only slight enlargement in size since study 2 weeks ago. Recommend continued follow-up with repeat ultrasound in 14 days to assess progression. Electronically Signed: By: Rolm Baptise M.D. On: 08/30/2019 15:07    MAU Course  Procedures  MDM  DW patient that [place] today shows IUP with 5 weeks 6 days and no cardiac activity. Reviewed options that include expectant management v cytotec. R/B reviewed. Patient would like to proceed with cytotec at this time. Reviewed how to take this medication and what to expect. Offered FU visit in our office in 2 weeks, but she would prefer to FU with her OB in [place].   Assessment and Plan    1. SAB (spontaneous abortion)   2. Vaginal bleeding in pregnancy, first trimester   3. [redacted] weeks gestation of pregnancy    RX Cytotec 800 mcg as directed, percocet PRN, Zofran PRN, Iron supplement daily.  Blood type: O+ Follow up: 2 weeks with OB in [place]   DC home Comfort measures reviewed  Bleeding precautions Return to MAU as needed FU with OB as  planned   Follow-up Information    Your OB in [facility] Follow up.   Why: FU in 2 weeks               [place], CNM  08/30/19  3:32 PM

## 2019-08-30 NOTE — Discharge Instructions (Signed)

## 2019-11-20 ENCOUNTER — Other Ambulatory Visit: Payer: Self-pay | Admitting: Advanced Practice Midwife

## 2020-02-02 DIAGNOSIS — Z3490 Encounter for supervision of normal pregnancy, unspecified, unspecified trimester: Secondary | ICD-10-CM | POA: Insufficient documentation

## 2020-02-02 DIAGNOSIS — O10919 Unspecified pre-existing hypertension complicating pregnancy, unspecified trimester: Secondary | ICD-10-CM | POA: Insufficient documentation

## 2020-02-02 HISTORY — DX: Unspecified pre-existing hypertension complicating pregnancy, unspecified trimester: O10.919

## 2020-02-21 IMAGING — US US OB < 14 WEEKS - US OB TV
1 series · 15 of 28 positions shown · non-contrast
Comparison: None.

CLINICAL DATA: Abdominal pain. History of ectopic pregnancy.
Estimated gestational age by LMP is 5 weeks 2 days. Quantitative
beta HCG is [DATE], rising from 5,602 on 01/24/2018

EXAM:
OBSTETRIC <14 WK US AND TRANSVAGINAL OB US
TECHNIQUE: Both transabdominal and transvaginal ultrasound examinations were
performed for complete evaluation of the gestation as well as the
maternal uterus, adnexal regions, and pelvic cul-de-sac.
Transvaginal technique was performed to assess early pregnancy.

[Series 1: us ob < 14 weeks - us ob tv · 15 of 54 slices shown]
[im 1/54]
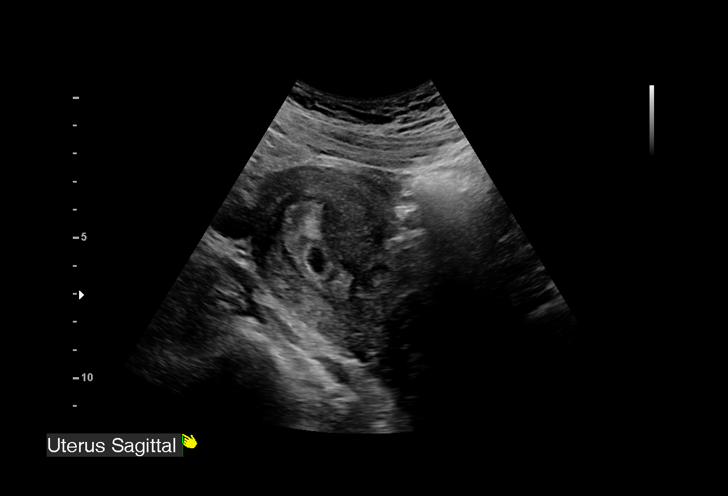
[im 4/54]
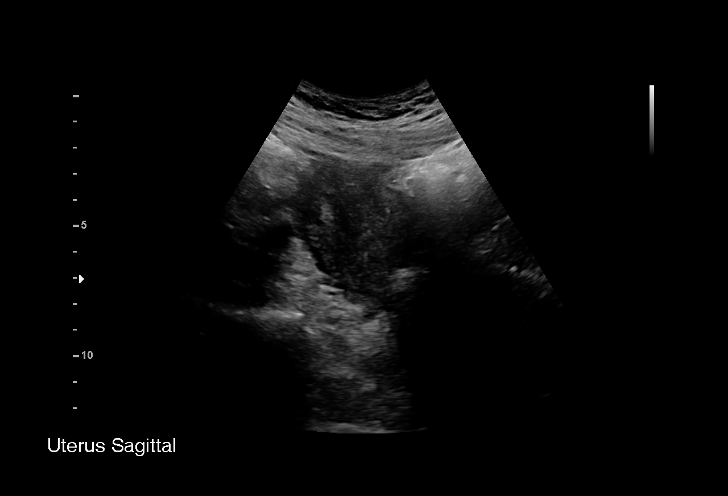
[im 8/54]
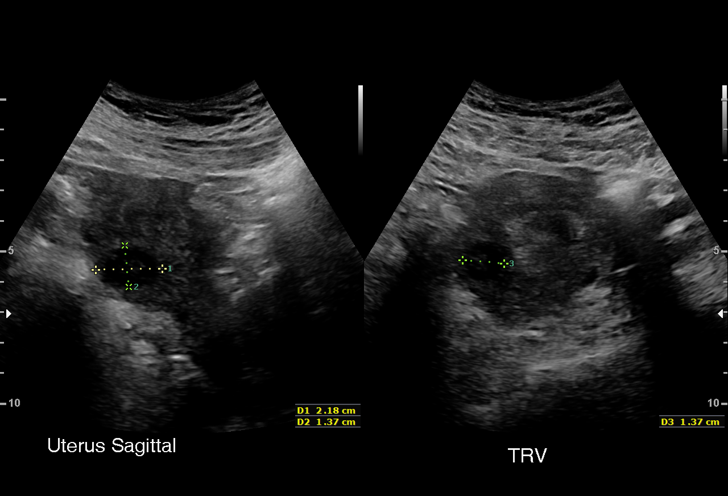
[im 12/54]
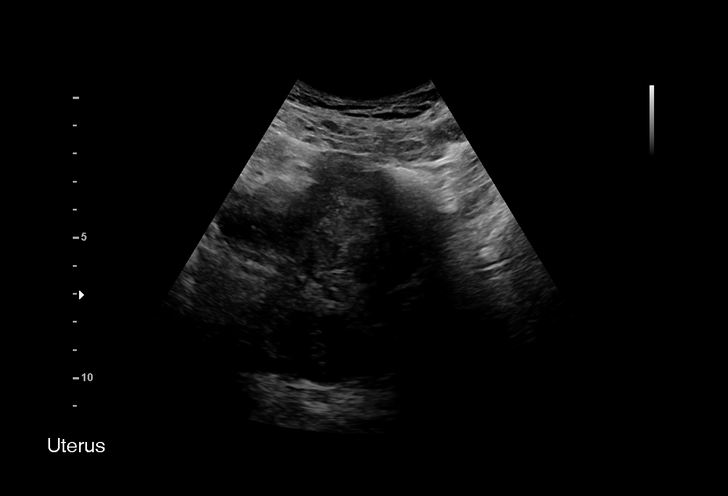
[im 16/54]
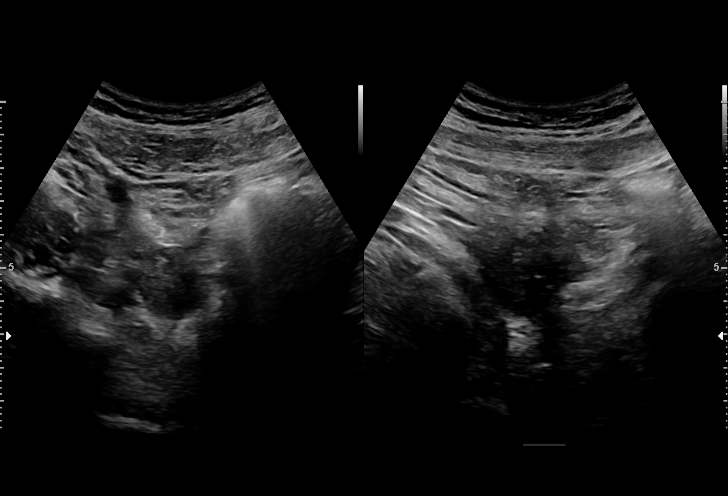
[im 20/54]
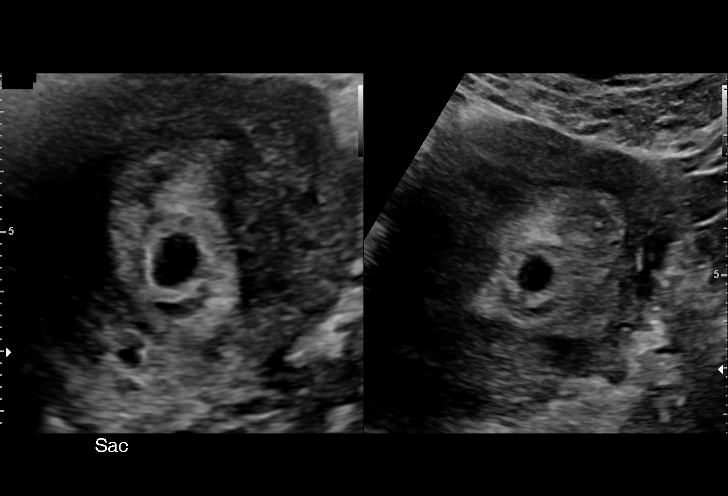
[im 24/54]
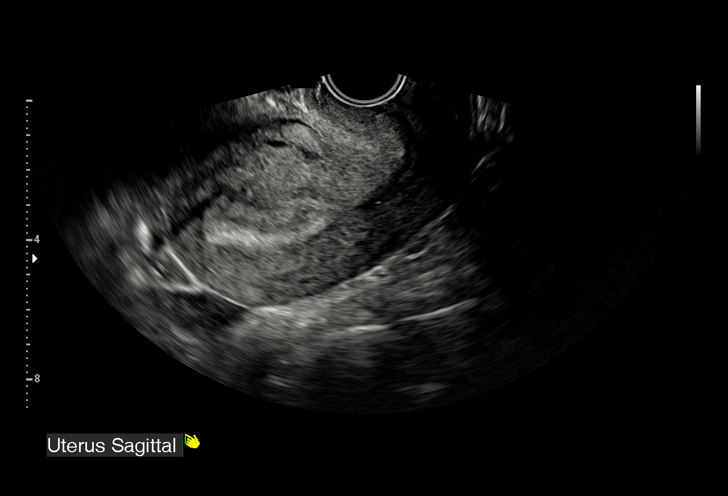
[im 28/54]
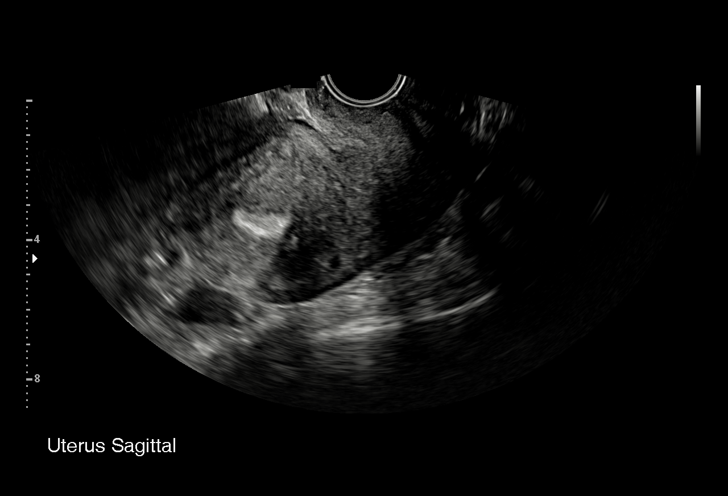
[im 30/54]
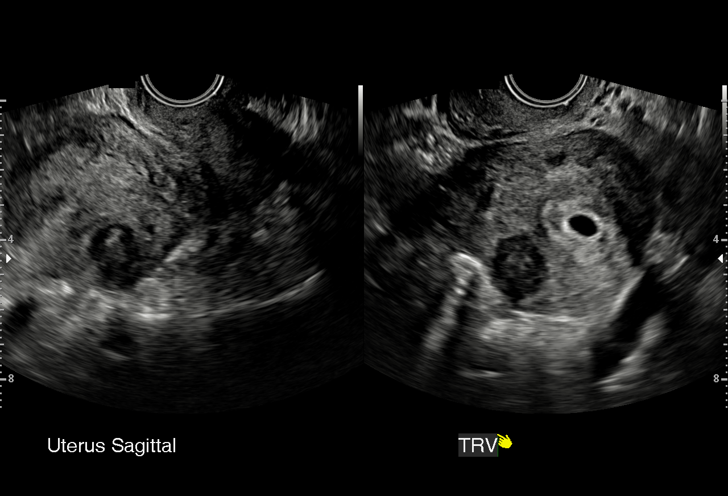
[im 34/54]
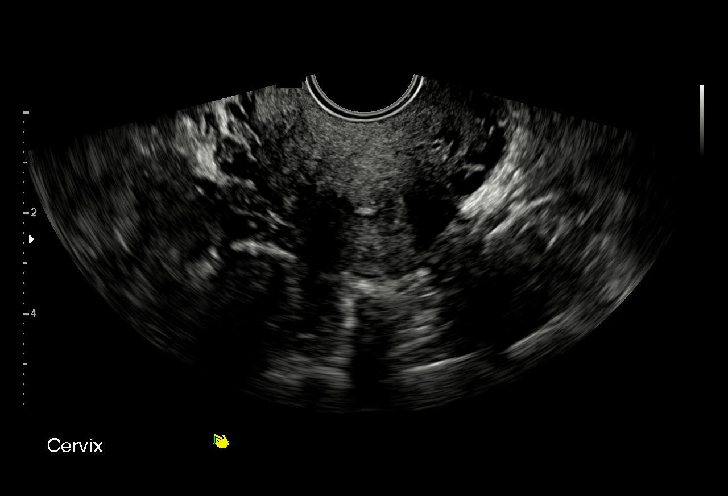
[im 38/54]
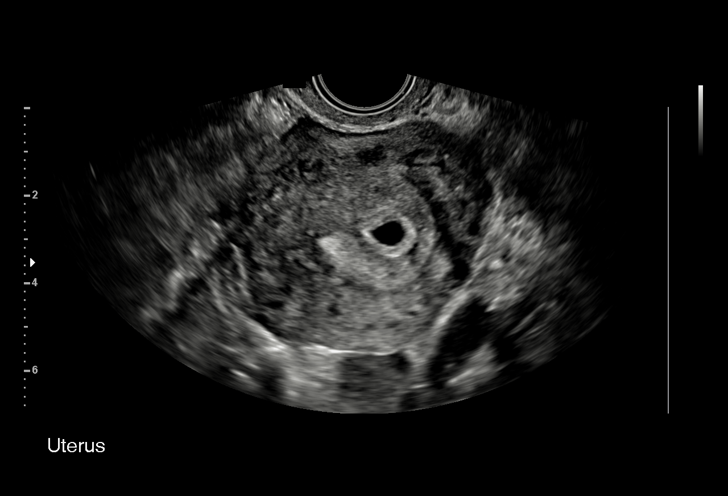
[im 42/54]
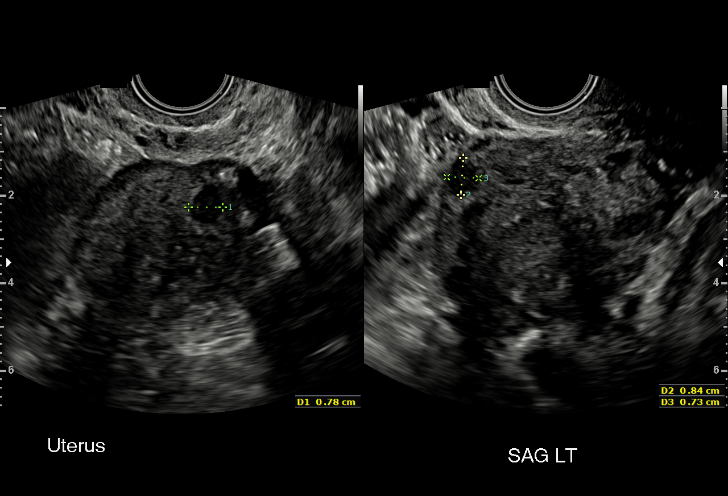
[im 46/54]
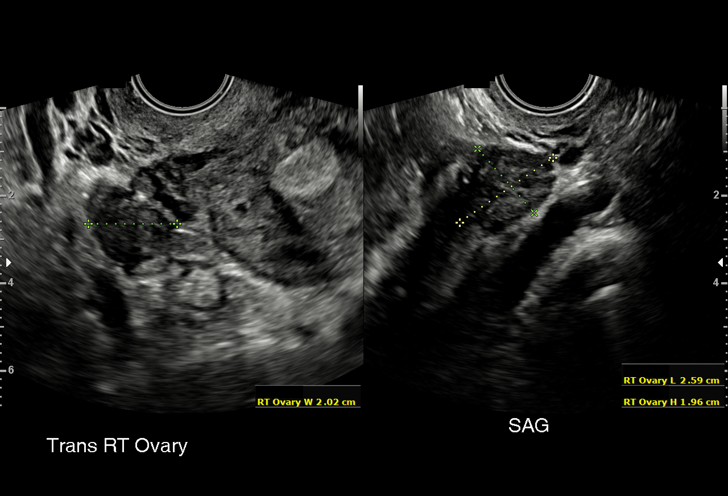
[im 50/54]
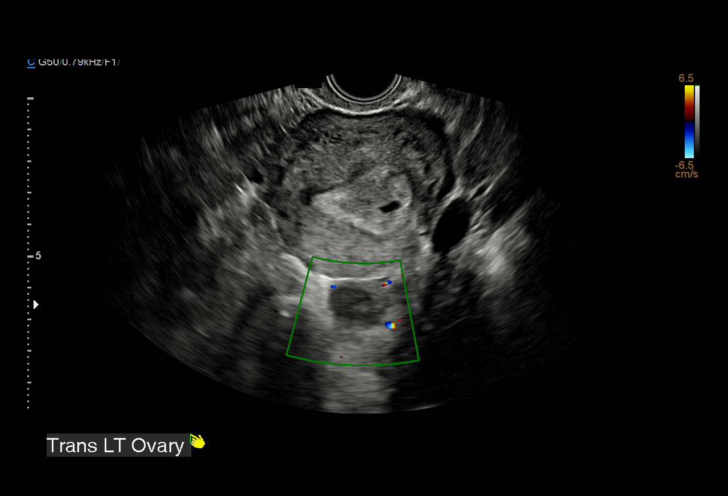
[im 54/54]
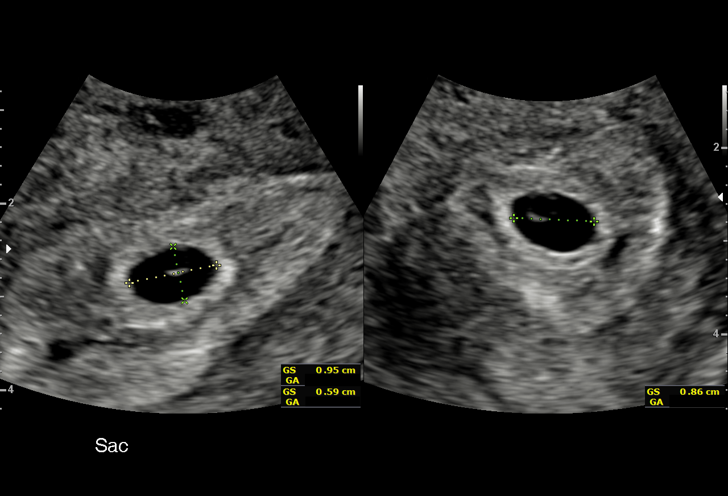

[15 of 28 positions shown; findings below may reference images not displayed]

FINDINGS: Intrauterine gestational sac: A single intrauterine gestational sac
is identified.

Yolk sac:  Yolk sac is present

Embryo:  Not identified

Cardiac Activity: Not identified

MSD: 8 mm   5 w   4 d

Subchorionic hemorrhage: A small subchorionic hemorrhage is
identified posterior superiorly.

Maternal uterus/adnexae: Uterus is anteverted. Heterogeneous
myometrial nodules are demonstrated consistent with uterine
fibroids. Largest is posterior and measures about 2 cm diameter.
Both ovaries are visualized and appear normal. No free pelvic fluid.
IMPRESSION: 1. Probable early intrauterine gestational sac with a yolk sac, but
no fetal pole, or cardiac activity yet visualized. Recommend
follow-up quantitative B-HCG levels and follow-up US in 14 days to
assess viability. This recommendation follows SRU consensus
guidelines: Diagnostic Criteria for Nonviable Pregnancy Early in the
First Trimester. N Engl J Med 2532; [DATE].
2. Small subchorionic hemorrhage.
3. Uterine fibroids.

## 2020-03-12 DIAGNOSIS — O09529 Supervision of elderly multigravida, unspecified trimester: Secondary | ICD-10-CM | POA: Insufficient documentation

## 2020-06-06 DIAGNOSIS — G5600 Carpal tunnel syndrome, unspecified upper limb: Secondary | ICD-10-CM

## 2020-06-06 HISTORY — DX: Carpal tunnel syndrome, unspecified upper limb: G56.00

## 2020-07-14 ENCOUNTER — Encounter (HOSPITAL_COMMUNITY): Payer: Self-pay | Admitting: Obstetrics and Gynecology

## 2020-07-14 ENCOUNTER — Inpatient Hospital Stay (HOSPITAL_COMMUNITY)
Admission: AD | Admit: 2020-07-14 | Discharge: 2020-07-14 | Disposition: A | Discharge status: Home / Self Care | Payer: Medicare Other | Attending: Obstetrics and Gynecology | Admitting: Obstetrics and Gynecology

## 2020-07-14 ENCOUNTER — Other Ambulatory Visit: Payer: Self-pay

## 2020-07-14 DIAGNOSIS — Z7982 Long term (current) use of aspirin: Secondary | ICD-10-CM | POA: Insufficient documentation

## 2020-07-14 DIAGNOSIS — Z3A29 29 weeks gestation of pregnancy: Secondary | ICD-10-CM | POA: Insufficient documentation

## 2020-07-14 DIAGNOSIS — O99891 Other specified diseases and conditions complicating pregnancy: Secondary | ICD-10-CM

## 2020-07-14 DIAGNOSIS — O09523 Supervision of elderly multigravida, third trimester: Secondary | ICD-10-CM | POA: Insufficient documentation

## 2020-07-14 DIAGNOSIS — R42 Dizziness and giddiness: Secondary | ICD-10-CM | POA: Insufficient documentation

## 2020-07-14 DIAGNOSIS — O26893 Other specified pregnancy related conditions, third trimester: Secondary | ICD-10-CM | POA: Insufficient documentation

## 2020-07-14 DIAGNOSIS — R251 Tremor, unspecified: Secondary | ICD-10-CM | POA: Diagnosis not present

## 2020-07-14 DIAGNOSIS — R0602 Shortness of breath: Secondary | ICD-10-CM | POA: Insufficient documentation

## 2020-07-14 DIAGNOSIS — R002 Palpitations: Secondary | ICD-10-CM | POA: Insufficient documentation

## 2020-07-14 DIAGNOSIS — Z87891 Personal history of nicotine dependence: Secondary | ICD-10-CM | POA: Insufficient documentation

## 2020-07-14 DIAGNOSIS — Z8759 Personal history of other complications of pregnancy, childbirth and the puerperium: Secondary | ICD-10-CM | POA: Insufficient documentation

## 2020-07-14 LAB — RAPID URINE DRUG SCREEN, HOSP PERFORMED
Amphetamines: NOT DETECTED
Barbiturates: NOT DETECTED
Benzodiazepines: NOT DETECTED
Cocaine: NOT DETECTED
Opiates: NOT DETECTED
Tetrahydrocannabinol: NOT DETECTED

## 2020-07-14 LAB — COMPREHENSIVE METABOLIC PANEL
ALT: 22 U/L (ref 0–44)
AST: 21 U/L (ref 15–41)
Albumin: 3 g/dL — ABNORMAL LOW (ref 3.5–5.0)
Alkaline Phosphatase: 80 U/L (ref 38–126)
Anion gap: 7 (ref 5–15)
BUN: 5 mg/dL — ABNORMAL LOW (ref 6–20)
CO2: 22 mmol/L (ref 22–32)
Calcium: 9.2 mg/dL (ref 8.9–10.3)
Chloride: 105 mmol/L (ref 98–111)
Creatinine, Ser: 0.57 mg/dL (ref 0.44–1.00)
GFR, Estimated: >60 mL/min (ref >60)
Glucose, Bld: 88 mg/dL (ref 70–99)
Potassium: 3.3 mmol/L — ABNORMAL LOW (ref 3.5–5.1)
Sodium: 134 mmol/L — ABNORMAL LOW (ref 135–145)
Total Bilirubin: 0.5 mg/dL (ref 0.3–1.2)
Total Protein: 6.5 g/dL (ref 6.5–8.1)

## 2020-07-14 LAB — URINALYSIS, ROUTINE W REFLEX MICROSCOPIC
Bilirubin Urine: NEGATIVE
Glucose, UA: NEGATIVE mg/dL
Hgb urine dipstick: NEGATIVE
Ketones, ur: 20 mg/dL — AB
Leukocytes,Ua: NEGATIVE
Nitrite: NEGATIVE
Protein, ur: NEGATIVE mg/dL
Specific Gravity, Urine: 1.012 (ref 1.005–1.030)
pH: 6 (ref 5.0–8.0)

## 2020-07-14 LAB — CBC WITH DIFFERENTIAL/PLATELET
Abs Immature Granulocytes: 0.02 10*3/uL (ref 0.00–0.07)
Basophils Absolute: 0 10*3/uL (ref 0.0–0.1)
Basophils Relative: 0 %
Eosinophils Absolute: 0 10*3/uL (ref 0.0–0.5)
Eosinophils Relative: 0 %
HCT: 31.9 % — ABNORMAL LOW (ref 36.0–46.0)
Hemoglobin: 10.3 g/dL — ABNORMAL LOW (ref 12.0–15.0)
Immature Granulocytes: 0 %
Lymphocytes Relative: 26 %
Lymphs Abs: 1.6 10*3/uL (ref 0.7–4.0)
MCH: 29.1 pg (ref 26.0–34.0)
MCHC: 32.3 g/dL (ref 30.0–36.0)
MCV: 90.1 fL (ref 80.0–100.0)
Monocytes Absolute: 0.4 10*3/uL (ref 0.1–1.0)
Monocytes Relative: 6 %
Neutro Abs: 4.3 10*3/uL (ref 1.7–7.7)
Neutrophils Relative %: 68 %
Platelets: 289 10*3/uL (ref 150–400)
RBC: 3.54 MIL/uL — ABNORMAL LOW (ref 3.87–5.11)
RDW: 14.5 % (ref 11.5–15.5)
WBC: 6.3 10*3/uL (ref 4.0–10.5)
nRBC: 0 % (ref 0.0–0.2)

## 2020-07-14 LAB — PROTEIN / CREATININE RATIO, URINE
Creatinine, Urine: 112.01 mg/dL
Protein Creatinine Ratio: 0.08 mg/mg{Cre} (ref 0.00–0.15)
Total Protein, Urine: 9 mg/dL

## 2020-07-14 NOTE — MAU Provider Note (Signed)
History     CSN: [id number]  Arrival date and time: 07/14/20 1228   Event Date/Time   First Provider Initiated Contact with Patient 07/14/20 1405      Chief Complaint  Patient presents with  . Palpitations   Alyssa Short is a 40 y.o. [id number] at [redacted]w[redacted]d who presents today with heart palpations. She states that this started a few days ago. It is worse when she is moving, and better when she is at rest. She reports feeling shaking when it is happening. She reports that drinking water helps. She reports that it does seem worse after drinking decaf coffee. She has had some shortness of breath when it happens. She reports that she has had something similar a few years ago that was due to anxiety, but this is lasting longer than it did when it happened a few years ago. She denies any contractions, VB or LOF. She reports normal fetal movement. Patient had high blood pressure with her last pregnancy. Current medications: ASA, prenatal vitamins   Palpitations  This is a new problem. Episode onset: 2 days ago. The problem occurs intermittently. The problem has been unchanged. The symptoms are aggravated by exercise. Associated symptoms include anxiety, dizziness and shortness of breath. Pertinent negatives include no fever. She has tried nothing for the symptoms.    OB History    Gravida  11   Para  3   Term  3   Preterm      AB  6   Living  3     SAB      IAB  5   Ectopic  1   Multiple      Live Births  3           Past Medical History:  Diagnosis Date  . Anxiety   . Blood transfusion without reported diagnosis   . Cancer ([place])    Leukemia APO  . Hypertension     Past Surgical History:  Procedure Laterality Date  . ECTOPIC PREGNANCY SURGERY    . INDUCED ABORTION      Family History  Problem Relation Age of Onset  . Cancer Mother   . Diabetes Brother   . Cirrhosis Father     Social History   Tobacco Use  . Smoking status: Former Research scientist (life sciences)  . Smokeless  tobacco: Never Used  Vaping Use  . Vaping Use: Never used  Substance Use Topics  . Alcohol use: Not Currently    Comment: rarely- red wine  . Drug use: Not Currently    Types: Marijuana    Allergies: No Known Allergies  Medications Prior to Admission  Medication Sig Dispense Refill Last Dose  . aspirin EC 81 MG tablet Take 81 mg by mouth daily. Swallow whole.   07/13/2020 at Unknown time  . prenatal vitamin w/FE, FA (PRENATAL 1 + 1) 27-1 MG TABS tablet Take 1 tablet by mouth daily at 12 noon.   07/14/2020 at Unknown time  . pseudoephedrine-acetaminophen (TYLENOL SINUS) 30-500 MG TABS tablet Take 1 tablet by mouth every 4 (four) hours as needed.     . ferrous sulfate 325 (65 FE) MG tablet Take 1 tablet (325 mg total) by mouth daily with breakfast. 30 tablet 3   . misoprostol (CYTOTEC) 200 MCG tablet Take 4 tablets (800 mcg total) by mouth once for 1 dose. 4 tablet 0   . ondansetron (ZOFRAN ODT) 8 MG disintegrating tablet Take 1 tablet (8 mg total) by mouth every 8 (eight)  hours as needed for nausea or vomiting. 20 tablet 0   . oxyCODONE-acetaminophen (PERCOCET/ROXICET) 5-325 MG tablet Take 1-2 tablets by mouth every 6 (six) hours as needed for severe pain. 15 tablet 0     Review of Systems  Constitutional: Negative for chills and fever.  Respiratory: Positive for shortness of breath.   Cardiovascular: Positive for palpitations.  Neurological: Positive for dizziness.  Psychiatric/Behavioral: The patient is nervous/anxious.    Physical Exam   Blood pressure 140/64, pulse (!) 138, resp. rate 18, SpO2 100 %, unknown if currently breastfeeding.  Physical Exam Vitals and nursing note reviewed. Exam conducted with a chaperone present.  Constitutional:      General: She is not in acute distress. HENT:     Head: Normocephalic.  Cardiovascular:     Rate and Rhythm: Normal rate.  Pulmonary:     Effort: Pulmonary effort is normal.  Abdominal:     Palpations: Abdomen is soft.      Tenderness: There is no abdominal tenderness.  Neurological:     Mental Status: She is alert and oriented to person, place, and time.  Psychiatric:        Mood and Affect: Mood normal.        Behavior: Behavior normal.    NST:  Baseline: 140 Variability: moderate Accels: 10x10 Decels: none Toco: none Reactive/Appropriate for GA  Results for orders placed or performed during the hospital encounter of 07/14/20 (from the past 24 hour(s))  Urinalysis, Routine w reflex microscopic Urine, Clean Catch     Status: Abnormal   Collection Time: 07/14/20  2:15 PM  Result Value Ref Range   Color, Urine YELLOW YELLOW   APPearance CLEAR CLEAR   Specific Gravity, Urine 1.012 1.005 - 1.030   pH 6.0 5.0 - 8.0   Glucose, UA NEGATIVE NEGATIVE mg/dL   Hgb urine dipstick NEGATIVE NEGATIVE   Bilirubin Urine NEGATIVE NEGATIVE   Ketones, ur 20 (A) NEGATIVE mg/dL   Protein, ur NEGATIVE NEGATIVE mg/dL   Nitrite NEGATIVE NEGATIVE   Leukocytes,Ua NEGATIVE NEGATIVE  Urine rapid drug screen (hosp performed)     Status: None   Collection Time: 07/14/20  2:16 PM  Result Value Ref Range   Opiates NONE DETECTED NONE DETECTED   Cocaine NONE DETECTED NONE DETECTED   Benzodiazepines NONE DETECTED NONE DETECTED   Amphetamines NONE DETECTED NONE DETECTED   Tetrahydrocannabinol NONE DETECTED NONE DETECTED   Barbiturates NONE DETECTED NONE DETECTED  Protein / creatinine ratio, urine     Status: None   Collection Time: 07/14/20  2:17 PM  Result Value Ref Range   Creatinine, Urine 112.01 mg/dL   Total Protein, Urine 9 mg/dL   Protein Creatinine Ratio 0.08 0.00 - 0.15 mg/mg[Cre]  CBC with Differential/Platelet     Status: Abnormal   Collection Time: 07/14/20  2:21 PM  Result Value Ref Range   WBC 6.3 4.0 - 10.5 K/uL   RBC 3.54 (L) 3.87 - 5.11 MIL/uL   Hemoglobin 10.3 (L) 12.0 - 15.0 g/dL   HCT 31.9 (L) 36.0 - 46.0 %   MCV 90.1 80.0 - 100.0 fL   MCH 29.1 26.0 - 34.0 pg   MCHC 32.3 30.0 - 36.0 g/dL   RDW  14.5 11.5 - 15.5 %   Platelets 289 150 - 400 K/uL   nRBC 0.0 0.0 - 0.2 %   Neutrophils Relative % 68 %   Neutro Abs 4.3 1.7 - 7.7 K/uL   Lymphocytes Relative 26 %   Lymphs  Abs 1.6 0.7 - 4.0 K/uL   Monocytes Relative 6 %   Monocytes Absolute 0.4 0.1 - 1.0 K/uL   Eosinophils Relative 0 %   Eosinophils Absolute 0.0 0.0 - 0.5 K/uL   Basophils Relative 0 %   Basophils Absolute 0.0 0.0 - 0.1 K/uL   Immature Granulocytes 0 %   Abs Immature Granulocytes 0.02 0.00 - 0.07 K/uL  Comprehensive metabolic panel     Status: Abnormal   Collection Time: 07/14/20  2:21 PM  Result Value Ref Range   Sodium 134 (L) 135 - 145 mmol/L   Potassium 3.3 (L) 3.5 - 5.1 mmol/L   Chloride 105 98 - 111 mmol/L   CO2 22 22 - 32 mmol/L   Glucose, Bld 88 70 - 99 mg/dL   BUN 5 (L) 6 - 20 mg/dL   Creatinine, Ser 0.57 0.44 - 1.00 mg/dL   Calcium 9.2 8.9 - 10.3 mg/dL   Total Protein 6.5 6.5 - 8.1 g/dL   Albumin 3.0 (L) 3.5 - 5.0 g/dL   AST 21 15 - 41 U/L   ALT 22 0 - 44 U/L   Alkaline Phosphatase 80 38 - 126 U/L   Total Bilirubin 0.5 0.3 - 1.2 mg/dL   GFR, Estimated >60 >60 mL/min   Anion gap 7 5 - 15     MAU Course  Procedures  MDM 1645PM: DW. Dr. Nelda Marseille, she recommends outpatient cardiology FU with likley holter monitor testing. Will send in referral for [facility] cardiologist, and also let patient know that she can ask her OB for a referral within [facility] if she prefers.   Assessment and Plan   1. [redacted] weeks gestation of pregnancy   2. Heart palpitations    DC home Comfort measures reviewed  3rd Trimester precautions  PTL precautions  Fetal kick counts AMB referral to Cardio OB @[facility]   Return to MAU as needed FU with OB as planned   [place] [facility] Follow up.   Specialty: Obstetrics and Gynecology Why: Cardiology will call you for an appointment  Contact information: [place]  [phone] [facility], CNM  07/14/20  5:01 PM

## 2020-07-14 NOTE — MAU Note (Addendum)
Pt reports for the last 3 days after drinking decaffeinated coffee she has felt heart flutters or palpations. Pt reports taking tums because she thought it was gas. Pt is unsure if it helped.   Pt reports the flutters are coming and going throughout the day. Pt reports she does not feel them if she is sitting down and relaxing. Pt reports feeling a little short of breath when it happens.   Pt reports a weird feeling in the middle of shoulders.   Denies feeling dizzy.

## 2020-07-14 NOTE — Discharge Instructions (Signed)

## 2020-07-16 DIAGNOSIS — R002 Palpitations: Secondary | ICD-10-CM | POA: Insufficient documentation

## 2020-07-16 DIAGNOSIS — O99210 Obesity complicating pregnancy, unspecified trimester: Secondary | ICD-10-CM | POA: Insufficient documentation

## 2020-08-03 DIAGNOSIS — F419 Anxiety disorder, unspecified: Secondary | ICD-10-CM | POA: Insufficient documentation

## 2020-08-03 DIAGNOSIS — C801 Malignant (primary) neoplasm, unspecified: Secondary | ICD-10-CM | POA: Insufficient documentation

## 2020-08-06 ENCOUNTER — Ambulatory Visit: Payer: Medicare Other | Admitting: Cardiology

## 2020-08-20 ENCOUNTER — Ambulatory Visit: Payer: Medicare Other | Admitting: Cardiology

## 2021-09-10 IMAGING — US US OB < 14 WEEKS - US OB TV
1 series · 15 of 28 positions shown · non-contrast
Comparison: None.

CLINICAL DATA: Recent fall with pelvic pain, positive pregnancy
test, initial encounter

EXAM:
OBSTETRIC <14 WK US AND TRANSVAGINAL OB US
TECHNIQUE: Both transabdominal and transvaginal ultrasound examinations were
performed for complete evaluation of the gestation as well as the
maternal uterus, adnexal regions, and pelvic cul-de-sac.
Transvaginal technique was performed to assess early pregnancy.

[Series 1: us ob < 14 weeks - us ob tv · 52 acquisitions, 15 frames shown]
[im 1/52]
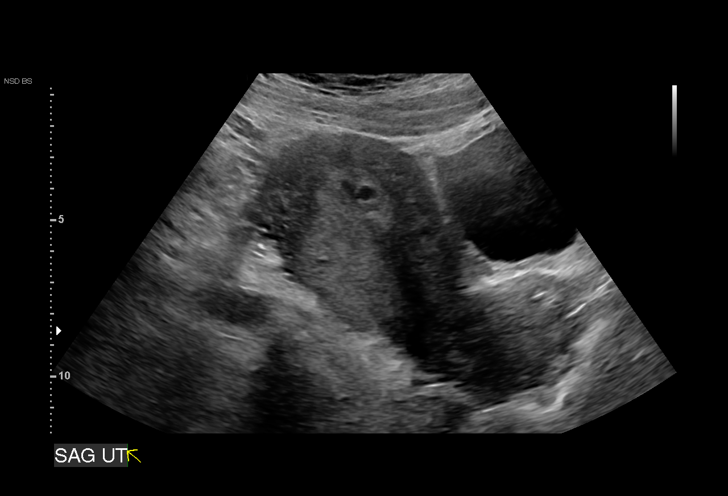
[im 4/52]
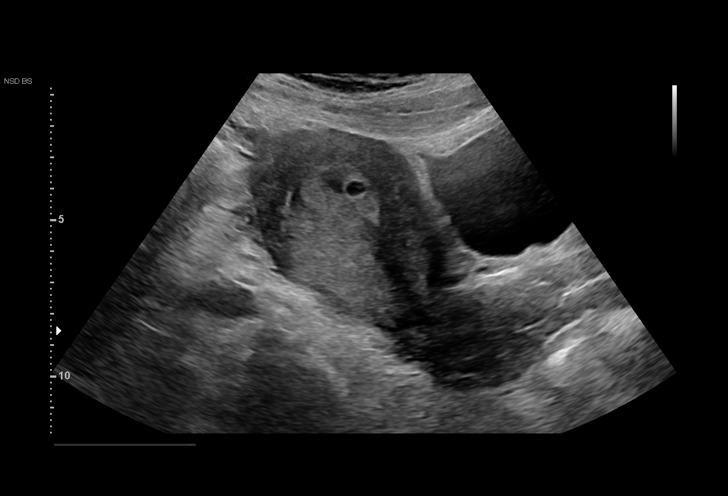
[im 8/52]
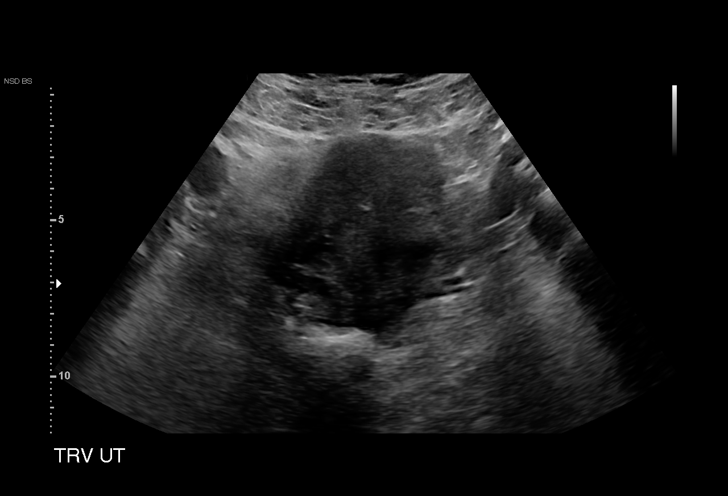
[im 12/52]
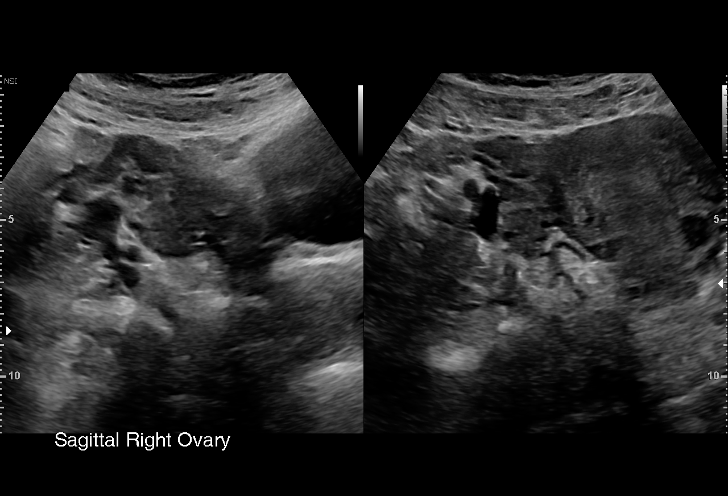
[im 16/52]
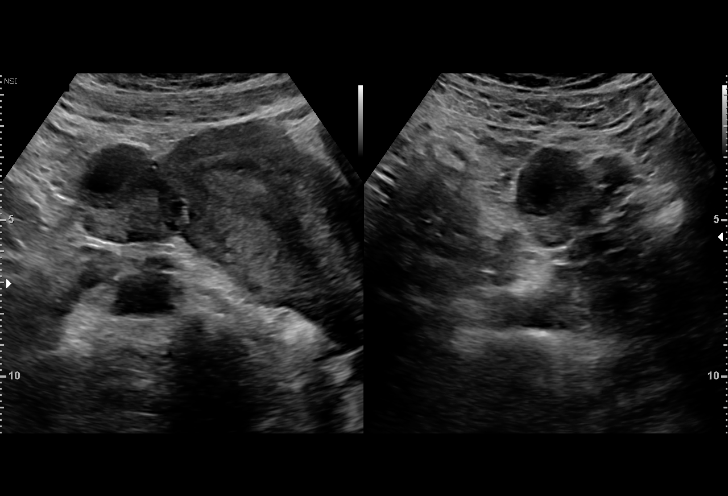
[im 19/52]
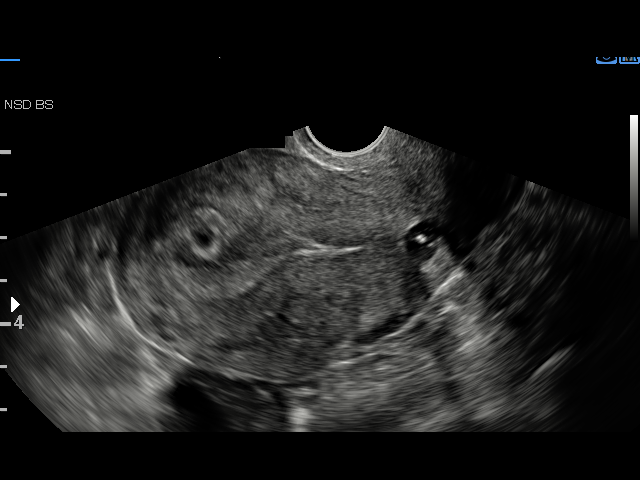
[im 23/52]
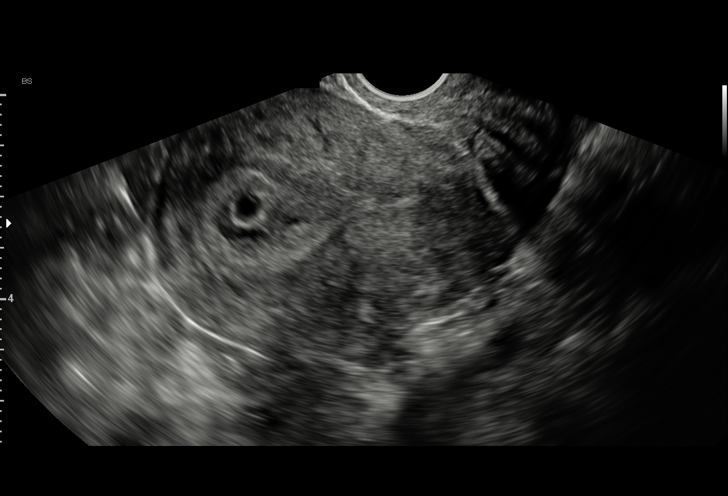
[im 27/52]
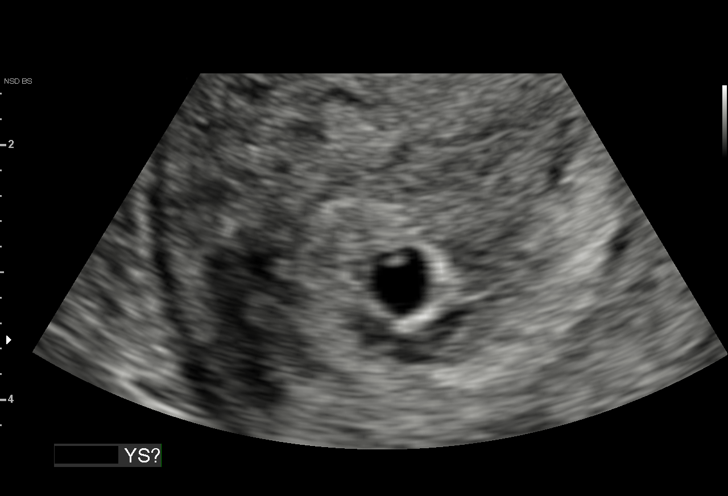
[im 29/52]
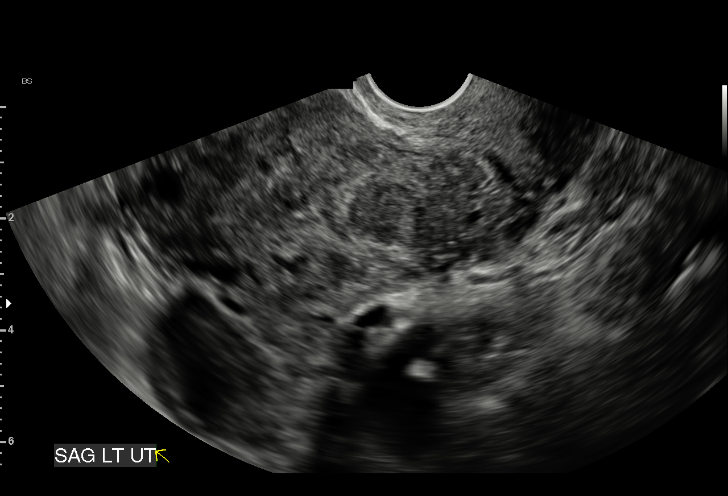
[im 33/52]
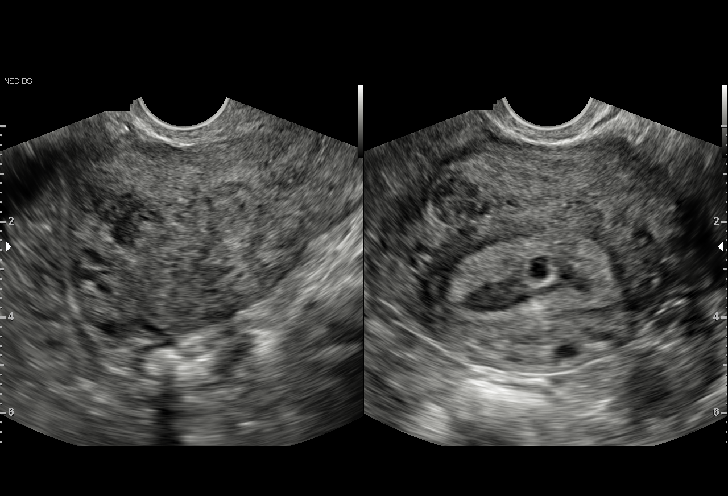
[im 36/52]
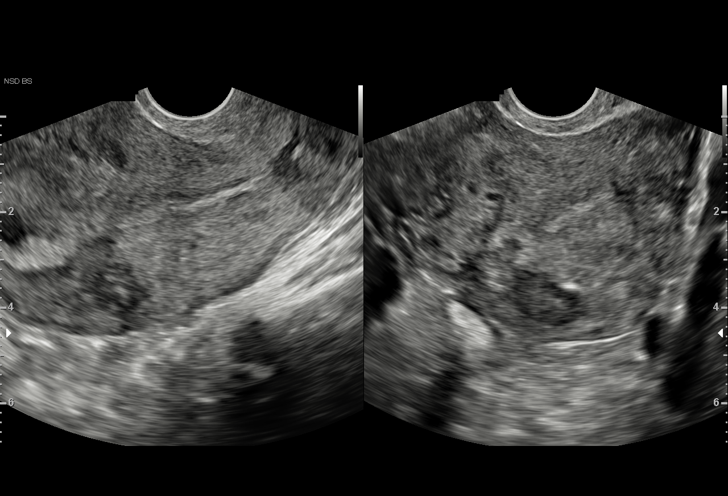
[im 40/52]
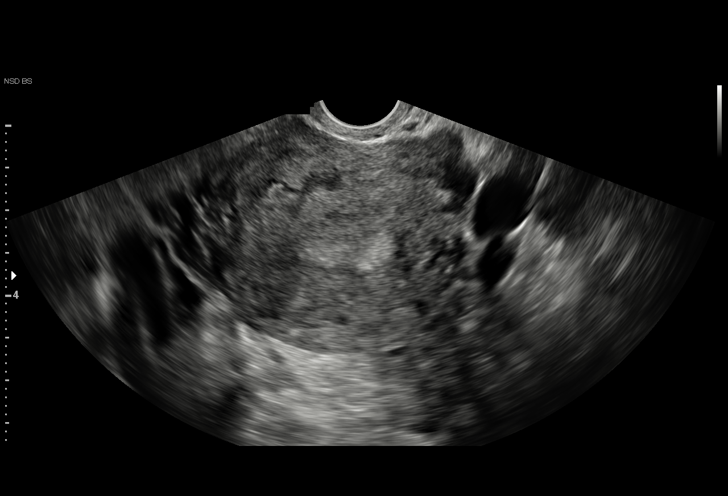
[im 44/52]
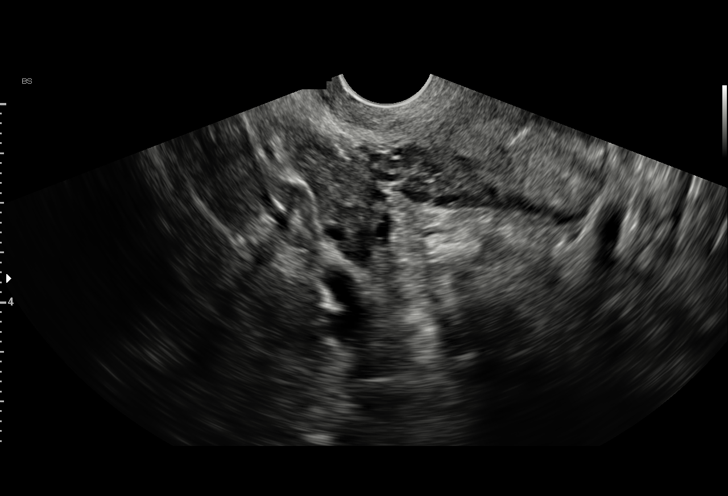
[im 48/52]
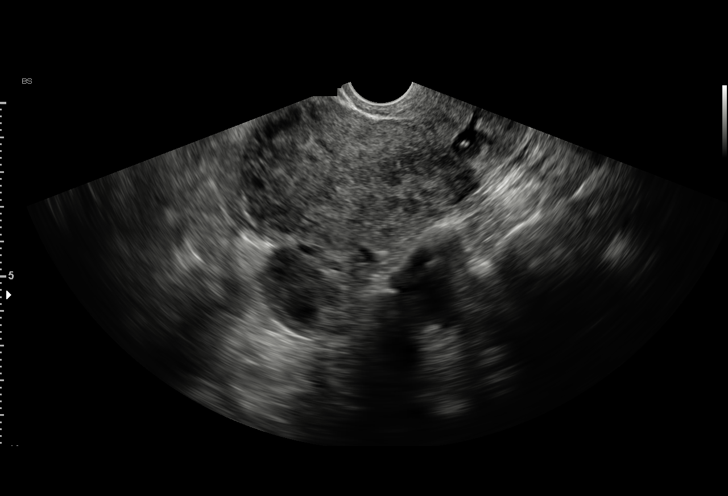
[im 52/52]
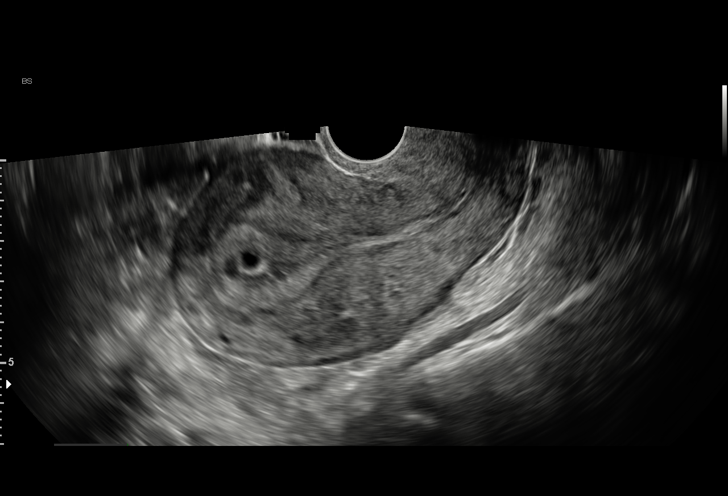

[15 of 28 positions shown; findings below may reference images not displayed]

FINDINGS: Intrauterine gestational sac: Present

Yolk sac:  Not definitely present

Embryo:  Absent

MSD: 4.9 mm 5 w   1 d

Subchorionic hemorrhage:  None visualized.

Maternal uterus/adnexae: Multiple small fibroids are noted within
the uterus. The largest of these measures 1.6 cm posteriorly.
IMPRESSION: Probable early intrauterine gestational sac, but no yolk sac, fetal
pole, or cardiac activity yet visualized. Recommend follow-up
quantitative B-HCG levels and follow-up US in 14 days to assess
viability. This recommendation follows SRU consensus guidelines:
Diagnostic Criteria for Nonviable Pregnancy Early in the First
Trimester. N Engl J Med 4334; [DATE].

Multiple small uterine fibroids.

## 2022-08-14 ENCOUNTER — Other Ambulatory Visit: Payer: Self-pay | Admitting: Orthopedic Surgery

## 2022-08-14 DIAGNOSIS — M25561 Pain in right knee: Secondary | ICD-10-CM

## 2022-08-14 DIAGNOSIS — M25461 Effusion, right knee: Secondary | ICD-10-CM

## 2022-08-20 ENCOUNTER — Ambulatory Visit
Admission: RE | Admit: 2022-08-20 | Discharge: 2022-08-20 | Disposition: A | Discharge status: Home / Self Care | Payer: Medicare Other | Source: Ambulatory Visit | Attending: Orthopedic Surgery | Admitting: Orthopedic Surgery

## 2022-08-20 DIAGNOSIS — M25461 Effusion, right knee: Secondary | ICD-10-CM

## 2022-08-20 DIAGNOSIS — M25561 Pain in right knee: Secondary | ICD-10-CM

## 2022-08-27 ENCOUNTER — Emergency Department (HOSPITAL_BASED_OUTPATIENT_CLINIC_OR_DEPARTMENT_OTHER): Payer: Medicare Other

## 2022-08-27 ENCOUNTER — Emergency Department (HOSPITAL_BASED_OUTPATIENT_CLINIC_OR_DEPARTMENT_OTHER)
Admission: EM | Admit: 2022-08-27 | Discharge: 2022-08-27 | Disposition: A | Discharge status: Home / Self Care | Payer: Medicare Other | Attending: Emergency Medicine | Admitting: Emergency Medicine

## 2022-08-27 ENCOUNTER — Other Ambulatory Visit: Payer: Self-pay

## 2022-08-27 ENCOUNTER — Encounter (HOSPITAL_BASED_OUTPATIENT_CLINIC_OR_DEPARTMENT_OTHER): Payer: Self-pay

## 2022-08-27 DIAGNOSIS — R0789 Other chest pain: Secondary | ICD-10-CM | POA: Insufficient documentation

## 2022-08-27 DIAGNOSIS — R079 Chest pain, unspecified: Secondary | ICD-10-CM

## 2022-08-27 DIAGNOSIS — Z7982 Long term (current) use of aspirin: Secondary | ICD-10-CM | POA: Diagnosis not present

## 2022-08-27 DIAGNOSIS — E871 Hypo-osmolality and hyponatremia: Secondary | ICD-10-CM | POA: Diagnosis not present

## 2022-08-27 DIAGNOSIS — Z856 Personal history of leukemia: Secondary | ICD-10-CM | POA: Insufficient documentation

## 2022-08-27 DIAGNOSIS — E876 Hypokalemia: Secondary | ICD-10-CM | POA: Diagnosis not present

## 2022-08-27 DIAGNOSIS — I10 Essential (primary) hypertension: Secondary | ICD-10-CM | POA: Diagnosis not present

## 2022-08-27 DIAGNOSIS — R000 Tachycardia, unspecified: Secondary | ICD-10-CM | POA: Diagnosis not present

## 2022-08-27 LAB — CBC WITH DIFFERENTIAL/PLATELET
Abs Immature Granulocytes: 0.04 10*3/uL (ref 0.00–0.07)
Basophils Absolute: 0.1 10*3/uL (ref 0.0–0.1)
Basophils Relative: 1 %
Eosinophils Absolute: 0 10*3/uL (ref 0.0–0.5)
Eosinophils Relative: 0 %
HCT: 38.2 % (ref 36.0–46.0)
Hemoglobin: 13 g/dL (ref 12.0–15.0)
Immature Granulocytes: 0 %
Lymphocytes Relative: 34 %
Lymphs Abs: 3.5 10*3/uL (ref 0.7–4.0)
MCH: 31 pg (ref 26.0–34.0)
MCHC: 34 g/dL (ref 30.0–36.0)
MCV: 91.2 fL (ref 80.0–100.0)
Monocytes Absolute: 0.6 10*3/uL (ref 0.1–1.0)
Monocytes Relative: 5 %
Neutro Abs: 6.1 10*3/uL (ref 1.7–7.7)
Neutrophils Relative %: 60 %
Platelets: 373 10*3/uL (ref 150–400)
RBC: 4.19 MIL/uL (ref 3.87–5.11)
RDW: 13.2 % (ref 11.5–15.5)
WBC: 10.3 10*3/uL (ref 4.0–10.5)
nRBC: 0 % (ref 0.0–0.2)

## 2022-08-27 LAB — COMPREHENSIVE METABOLIC PANEL
ALT: 28 U/L (ref 0–44)
AST: 20 U/L (ref 15–41)
Albumin: 4.4 g/dL (ref 3.5–5.0)
Alkaline Phosphatase: 69 U/L (ref 38–126)
Anion gap: 11 (ref 5–15)
BUN: 14 mg/dL (ref 6–20)
CO2: 23 mmol/L (ref 22–32)
Calcium: 9.3 mg/dL (ref 8.9–10.3)
Chloride: 99 mmol/L (ref 98–111)
Creatinine, Ser: 1 mg/dL (ref 0.44–1.00)
GFR, Estimated: >60 mL/min (ref >60)
Glucose, Bld: 101 mg/dL — ABNORMAL HIGH (ref 70–99)
Potassium: 3.3 mmol/L — ABNORMAL LOW (ref 3.5–5.1)
Sodium: 133 mmol/L — ABNORMAL LOW (ref 135–145)
Total Bilirubin: 0.7 mg/dL (ref 0.3–1.2)
Total Protein: 8.2 g/dL — ABNORMAL HIGH (ref 6.5–8.1)

## 2022-08-27 LAB — TROPONIN I (HIGH SENSITIVITY)
Troponin I (High Sensitivity): <2 ng/L (ref <18)
Troponin I (High Sensitivity): <2 ng/L (ref <18)

## 2022-08-27 NOTE — ED Triage Notes (Addendum)
Pt states she has had CP x 4 days.   Was seen at UC 2 days ago and labs and EKG were normal No N/V Describes it as pressure no pain Scheduled for a stress ECHO Was started on a beta blocker

## 2022-08-27 NOTE — Discharge Instructions (Signed)
Please use Tylenol or ibuprofen for pain.  You may use 600 mg ibuprofen every 6 hours or 1000 mg of Tylenol every 6 hours.  You may choose to alternate between the 2.  This would be most effective.  Not to exceed 4 g of Tylenol within 24 hours.  Not to exceed 3200 mg ibuprofen 24 hours.  

## 2022-08-27 NOTE — ED Provider Notes (Signed)
[place] [facility] AT [facility] Provider Note   CSN: [id number] Arrival date & time: 08/27/22  1945     History  Chief Complaint  Patient presents with   Chest Pain    Alyssa Short is a 42 y.o. female past medical history significant for anxiety, previously noted systolic heart murmur of unclear etiology for which she has cardiology follow-up and echo planned who presents with concern for chest pain/pressure for 4 days.  Patient was seen at urgent care 2 days ago and had normal evaluation at that time.  She denies any shortness of breath, nausea, vomiting.  She reports it is intermittent in nature with no known aggravating or relieving symptoms.  She reports she was recently started on a beta-blocker for her intermittent tachycardia.  Notably she did have a D-dimer at the urgent care which was negative.  She denies any recent travel, hormonal birth control use, previous cancer.  She reports no significant chest pain at time of my evaluation.   Chest Pain      Home Medications Prior to Admission medications   Medication Sig Start Date End Date Taking? Authorizing Provider  aspirin EC 81 MG tablet Take 81 mg by mouth daily. Swallow whole.    [provider]  ferrous sulfate 325 (65 FE) MG tablet Take 1 tablet (325 mg total) by mouth daily with breakfast. 08/30/19   Armando Reichert, CNM  misoprostol (CYTOTEC) 200 MCG tablet Take 4 tablets (800 mcg total) by mouth once for 1 dose. 08/30/19 08/30/19  Thressa Sheller D, CNM  prenatal vitamin w/FE, FA (PRENATAL 1 + 1) 27-1 MG TABS tablet Take 1 tablet by mouth daily at 12 noon.    [provider]  pseudoephedrine-acetaminophen (TYLENOL SINUS) 30-500 MG TABS tablet Take 1 tablet by mouth every 4 (four) hours as needed.    [provider]      Allergies    Patient has no known allergies.    Review of Systems   Review of Systems  Cardiovascular:  Positive for chest pain.  All other systems  reviewed and are negative.   Physical Exam Updated Vital Signs BP (!) 165/75   Pulse (!) 105   Temp 98.1 F (36.7 C) (Oral)   Resp (!) 22   Ht 5\' 2"  (1.575 m)   Wt 98.4 kg   LMP 08/05/2022 (Exact Date)   SpO2 100%   BMI 39.69 kg/m  Physical Exam Vitals and nursing note reviewed.  Constitutional:      General: She is not in acute distress.    Appearance: Normal appearance.  HENT:     Head: Normocephalic and atraumatic.  Eyes:     General:        Right eye: No discharge.        Left eye: No discharge.  Cardiovascular:     Rate and Rhythm: Normal rate and regular rhythm.     Heart sounds: No murmur heard.    No friction rub. No gallop.     Comments: Patient does have systolic murmur heard best at second ICS midsternal, otherwise normal heart rate and rhythm Pulmonary:     Effort: Pulmonary effort is normal.     Breath sounds: Normal breath sounds.     Comments: No wheezing, rhonchi, crackles, rales, no respiratory distress Abdominal:     General: Bowel sounds are normal.     Palpations: Abdomen is soft.  Skin:    General: Skin is warm and dry.  Capillary Refill: Capillary refill takes less than 2 seconds.  Neurological:     Mental Status: She is alert and oriented to person, place, and time.  Psychiatric:        Mood and Affect: Mood normal.        Behavior: Behavior normal.     ED Results / Procedures / Treatments   Labs (all labs ordered are listed, but only abnormal results are displayed) Labs Reviewed  COMPREHENSIVE METABOLIC PANEL - Abnormal; Notable for the following components:      Result Value   Sodium 133 (*)    Potassium 3.3 (*)    Glucose, Bld 101 (*)    Total Protein 8.2 (*)    All other components within normal limits  CBC WITH DIFFERENTIAL/PLATELET  TROPONIN I (HIGH SENSITIVITY)  TROPONIN I (HIGH SENSITIVITY)    EKG None  Radiology DG Chest 2 View  Result Date: 08/27/2022 CLINICAL DATA:  chest pain Chest pressure x 4 days.  Recently started a beta blocker. EXAM: CHEST - 2 VIEW COMPARISON:  Chest x-ray 08/24/2022 FINDINGS: The heart and mediastinal contours are within normal limits. No focal consolidation. No pulmonary edema. No pleural effusion. No pneumothorax. No acute osseous abnormality. IMPRESSION: No active cardiopulmonary disease. Electronically Signed   By: Tish Frederickson M.D.   On: 08/27/2022 22:59    Procedures Procedures    Medications Ordered in ED Medications - No data to display  ED Course/ Medical Decision Making/ A&P                             Medical Decision Making Amount and/or Complexity of Data Reviewed Labs: ordered. Radiology: ordered.   This patient is a 42 y.o. female  who presents to the ED for concern of chest pain.   Differential diagnoses prior to evaluation: The emergent differential diagnosis includes, but is not limited to,  ACS, AAS, PE, Mallory-Weiss, Boerhaave's, Pneumonia, acute bronchitis, asthma or COPD exacerbation, anxiety, MSK pain or traumatic injury to the chest, acid reflux versus other . This is not an exhaustive differential.   Past Medical History / Co-morbidities / Social History: Hypertension, obesity, acute promyelocytic leukemia in remission  Additional history: Chart reviewed. Pertinent results include: Extensively reviewed lab work, and evaluation urgent care just 2 days ago when her chest pain began  Physical Exam: Physical exam performed. The pertinent findings include: Patient does have some hypertension in the emergency department, blood pressure 157/90, 165/75 at time of discharge.  She has intermittent tachycardia with normal heart rhythm.  She did not arrive with any tachypnea, some brief mild tachypneic episodes prior to discharge, patient with no respiratory distress, no accessory breath sounds, may be secondary to anxiety versus unclear etiology, does have a least 3 out of 6 to 4 out of 6 systolic murmur best heard at second ICS  Lab  Tests/Imaging studies: I personally interpreted labs/imaging and the pertinent results include: CMP notable for mild hyponatremia, sodium 133, mild hypokalemia, potassium 3.3, otherwise unremarkable, CBC unremarkable, troponin negative x 2 context chest pain ongoing for 4 days.  Considered D-dimer on this patient with unexplained pressure-like chest pain, as well as intermittent tachycardia, however patient had a D-dimer just 2 days ago which was normal, and has not had any new travel or other risk factors for developing spontaneous blood clot in the interim 2 days, she reports that her tachycardia is chronic, she was recently placed on a beta-blocker for this.  And family interpreted plain, chest x-ray shows no evidence of acute intrathoracic abnormality.  I agree with the radiologist interpretation.  Cardiac monitoring: EKG obtained and interpreted by myself and attending physician which shows: Sinus tachycardia   Disposition: After consideration of the diagnostic results and the patients response to treatment, I feel that patient with no evidence of acute cardiac or pulmonary cause of her chest pain at this time, does seem that it is worsened with some arm movements, may be secondary to her new workout routine, she does have close follow-up scheduled with cardiology which I feel is appropriate, especially she has not had formal evaluation of her murmur in the past, murmur is longstanding at this time however.  Patient is stable for discharge at this time, with no significant chest pain at time of discharge..   emergency department workup does not suggest an emergent condition requiring admission or immediate intervention beyond what has been performed at this time. The plan is: as above. The patient is safe for discharge and has been instructed to return immediately for worsening symptoms, change in symptoms or any other concerns.  Final Clinical Impression(s) / ED Diagnoses Final diagnoses:  Chest  pain, unspecified type    Rx / DC Orders ED Discharge Orders     None         [place] 08/28/22 1411    Arby Barrette, MD 09/05/22 1615

## 2023-10-12 ENCOUNTER — Encounter (HOSPITAL_BASED_OUTPATIENT_CLINIC_OR_DEPARTMENT_OTHER): Payer: Self-pay | Admitting: Emergency Medicine

## 2023-10-12 ENCOUNTER — Emergency Department (HOSPITAL_BASED_OUTPATIENT_CLINIC_OR_DEPARTMENT_OTHER)
Admission: EM | Admit: 2023-10-12 | Discharge: 2023-10-12 | Disposition: A | Discharge status: Home / Self Care | Attending: Emergency Medicine | Admitting: Emergency Medicine

## 2023-10-12 ENCOUNTER — Other Ambulatory Visit: Payer: Self-pay

## 2023-10-12 DIAGNOSIS — M25562 Pain in left knee: Secondary | ICD-10-CM | POA: Diagnosis not present

## 2023-10-12 DIAGNOSIS — Y92410 Unspecified street and highway as the place of occurrence of the external cause: Secondary | ICD-10-CM | POA: Insufficient documentation

## 2023-10-12 DIAGNOSIS — M542 Cervicalgia: Secondary | ICD-10-CM | POA: Diagnosis present

## 2023-10-12 DIAGNOSIS — Z7982 Long term (current) use of aspirin: Secondary | ICD-10-CM | POA: Diagnosis not present

## 2023-10-12 LAB — HCG, SERUM, QUALITATIVE: Preg, Serum: NEGATIVE

## 2023-10-12 MED ORDER — OXYCODONE HCL 5 MG PO TABS
5.0000 mg | ORAL_TABLET | Freq: Once | ORAL | Status: AC
  Administered 2023-10-12: 5 mg via ORAL
  Filled 2023-10-12: qty 1

## 2023-10-12 MED ORDER — ACETAMINOPHEN 500 MG PO TABS
1000.0000 mg | ORAL_TABLET | Freq: Once | ORAL | Status: AC
  Administered 2023-10-12: 1000 mg via ORAL
  Filled 2023-10-12: qty 2

## 2023-10-12 MED ORDER — METHOCARBAMOL 500 MG PO TABS: 500.0000 mg | ORAL_TABLET | Freq: Two times a day (BID) | ORAL | 0 refills | Status: AC

## 2023-10-12 MED ORDER — KETOROLAC TROMETHAMINE 15 MG/ML IJ SOLN
15.0000 mg | Freq: Once | INTRAMUSCULAR | Status: AC
  Administered 2023-10-12: 15 mg via INTRAMUSCULAR
  Filled 2023-10-12: qty 1
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
## 2023-10-12 NOTE — Discharge Instructions (Signed)
 You will hurt worse tomorrow that is normal.  Symptoms typically will get better throughout the course of the week.  Please return for sudden worsening pain left arm numbness or weakness.  Please follow-up with your PCP.  Take 4 over the counter ibuprofen tablets 3 times a day or 2 over-the-counter naproxen tablets twice a day for pain. Also take tylenol  1000mg (2 extra strength) four times a day.
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
